# Patient Record
Sex: Male | Born: 1985 | Race: Asian | Hispanic: No | Smoking: Never smoker
Health system: Southern US, Community
[De-identification: ages and names within clinical notes are randomized; demographics above are authoritative.]

## PROBLEM LIST (undated history)

## (undated) DIAGNOSIS — C801 Malignant (primary) neoplasm, unspecified: Secondary | ICD-10-CM

## (undated) DIAGNOSIS — R51 Headache: Secondary | ICD-10-CM

## (undated) DIAGNOSIS — F329 Major depressive disorder, single episode, unspecified: Secondary | ICD-10-CM

## (undated) DIAGNOSIS — F32A Depression, unspecified: Secondary | ICD-10-CM

## (undated) DIAGNOSIS — F419 Anxiety disorder, unspecified: Secondary | ICD-10-CM

## (undated) DIAGNOSIS — I10 Essential (primary) hypertension: Secondary | ICD-10-CM

## (undated) DIAGNOSIS — R519 Headache, unspecified: Secondary | ICD-10-CM

---

## 2014-01-14 ENCOUNTER — Other Ambulatory Visit (HOSPITAL_COMMUNITY): Payer: Self-pay | Admitting: Otolaryngology

## 2014-01-14 DIAGNOSIS — R590 Localized enlarged lymph nodes: Secondary | ICD-10-CM

## 2014-01-21 ENCOUNTER — Ambulatory Visit (HOSPITAL_COMMUNITY)
Admission: RE | Admit: 2014-01-21 | Discharge: 2014-01-21 | Disposition: A | Discharge status: Home / Self Care | Payer: BC Managed Care – PPO | Source: Ambulatory Visit | Attending: Otolaryngology | Admitting: Otolaryngology

## 2014-01-21 ENCOUNTER — Encounter (HOSPITAL_COMMUNITY): Payer: Self-pay

## 2014-01-21 DIAGNOSIS — R590 Localized enlarged lymph nodes: Secondary | ICD-10-CM | POA: Insufficient documentation

## 2014-01-21 MED ORDER — IOHEXOL 300 MG/ML  SOLN
80.0000 mL | Freq: Once | INTRAMUSCULAR | Status: AC | PRN
  Administered 2014-01-21: 80 mL via INTRAVENOUS

## 2014-01-22 ENCOUNTER — Other Ambulatory Visit: Payer: Self-pay | Admitting: Otolaryngology

## 2014-02-05 ENCOUNTER — Other Ambulatory Visit: Payer: Self-pay | Admitting: Otolaryngology

## 2014-02-05 NOTE — H&P (Signed)
[place], Haste 28 y.o., male [id number]     Chief Complaint:  RIGHT thyroid cancer with adenopathy and  HPI: 28 year old Asian male was noted to have some fullness in his lower neck on routine physical exam in JUN. He lives in [place] and his evaluation thus far has been performed there.  In uncertain order, he had a thyroid ultrasound, and a CT scan of the neck.  A CT scan showed multiple RIGHT neck nodes and some small nodules in the RIGHT thyroid gland.  I reviewed this report but do not have the films to review.  A fine needle aspiration biopsy showed thyroid cancer, type unspecified.  I do not have any of these records.  Dr. Cherrie Distance, presumably a surgeon in [place], did not want to work with him further and referred him to [facility] [place].  His brother lives in [place] and brought him to see [place] instead.   He is anxious about the mass, but otherwise has absolutely no symptoms including pain, breathing or swallowing difficulty, or any mass effect.  No family history of thyroid cancer.  No history of radiation therapy in any form.  He does not smoke.   No hoarseness or hemoptysis.  No other imaging thus far.   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  He is not aware of any particular change in the size of the masses since first discovered in June.  Preoperative visit for this 28 year old Asian male.  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  Where on scheduled to perform a RIGHT neck dissection (including level VI) and total thyroidectomy  in 2 days.  He is not having any active symptoms.   I discussed the surgery in detail including risks and complications.  Special mention was made of the parathyroid glands, the recurrent laryngeal nerves, and the RIGHT spinal accessory nerve given his vigorous occupation.  Questions were answered and informed consent was  obtained.  Will be in the hospital several days.  Prescriptions for home use include hydrocodone, calcium tablets, and Synthroid.  We will check a comprehensive metabolic panel prior to surgery.  PMH:No past medical history on file.  Surg Hx:No past surgical history on file.  FHx:  No family history on file. SocHx:  has no tobacco, alcohol, and drug history on file.  ALLERGIES: No Known Allergies   (Not in a hospital admission)  No results found for this or any previous visit (from the past 48 hour(s)). No results found.  WUJ:WJXBJYNW: Not feeling tired (fatigue).  No fever, no night sweats, and no recent weight loss. Head: No headache. Eyes: No eye symptoms. Otolaryngeal: No hearing loss, no earache, no tinnitus, and no purulent nasal discharge.  No nasal passage blockage (stuffiness), no snoring, no sneezing, no hoarseness, and no sore throat. Cardiovascular: No chest pain or discomfort  and no palpitations. Pulmonary: No dyspnea, no cough, and no wheezing. Gastrointestinal: No dysphagia  and no heartburn.  No nausea, no abdominal pain, and no melena.  No diarrhea. Genitourinary: No dysuria. Endocrine: No muscle weakness. Musculoskeletal: No calf muscle cramps, no arthralgias, and no soft tissue swelling. Neurological: No dizziness, no fainting, no tingling, and no numbness. Psychological: No anxiety  and no depression. Skin: No rash.   BP:155/94,  HR: 93 b/min,  Weight: 253 lb ,  BMI Calculated: 39.33 ,    PHYSICAL EXAM: He is stocky and  overweight.  Mental status is appropriate.  He hears well in conversational speech.  Voice is clear and respirations unlabored through the nose.  The head is atraumatic and neck supple.  Cranial nerves intact.  Ear canals are clear.  Anterior nose clear.  Oral cavity is clear with teeth in good repair.  Oral pharynx clear.  Hypopharynx/larynx with mirror examination show mobile vocal cords.  Neck exam with some fullness in the RIGHT thyroid  region.  No distinct adenopathy.  Negative Chvostek's sign  on both sides.     Lungs: Clear to auscultation Heart: Regular rate and rhythm without murmurs Abdomen: Heavy but active Extremities: Normal configuration Neurologic: Symmetric, grossly intact.  Studies Reviewed:  CT Neck    Assessment/Plan Thyroid cancer Worsening (193) (C73). Cervical lymphadenopathy (785.6) (R59.0). Benign essential hypertension Improving (401.1) (I10). BMI 39.0-39.9,adult (V85.39) (Z68.39).  Your surgery is scheduled for Thursday morning at 10:00.  Show up at the main entrance to [facility] at 8:00 please.  I am leaving you with 3 prescriptions: one for calcium, one for thyroid replacement, and one for pain medication.  you will not need these until you get home from the hospital.  Buy a small bottle of chlorhexidine (Hibiclens) scrub soap and shower with this Wednesday night and again Thursday morning especially your upper chest and both sides of your neck.   You will be in the hospital probably 4-5 days.  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  Calcium 600+D 600-200 MG-UNIT Oral Tablet;TAKE 1 TABLET 3 TIMES DAILY; Qty45; R1; Rx. Hydrocodone-Acetaminophen 5-325 MG Oral Tablet;1-2 po q4-6h prn pain; Qty40; R0; Rx. Levothyroxine Sodium 125 MCG Oral Tablet;TAKE 1 TABLET DAILY; JSE83; R2; Rx.  Erik Obey, Makinzie Considine 15/17/6160, 8:34 PM

## 2014-02-06 ENCOUNTER — Encounter (HOSPITAL_COMMUNITY): Payer: Self-pay | Admitting: *Deleted

## 2014-02-06 NOTE — Progress Notes (Signed)
[place] and requested copies of EKG, CXR, Stress test, Echo and last OV notes. Pt states he had all of this done this past summer. He states his EKG showed "something" when he went in for a work physical and that is why he ended up having an Echo and stress test. He states that those tests were all normal. He states he has never been diagnosed with hypo or hyperthyroidism.

## 2014-02-07 ENCOUNTER — Inpatient Hospital Stay (HOSPITAL_COMMUNITY)
Admission: RE | Admit: 2014-02-07 | Discharge: 2014-02-13 | DRG: 626 | Disposition: A | Discharge status: Home / Self Care | Payer: BC Managed Care – PPO | Source: Ambulatory Visit | Attending: Otolaryngology | Admitting: Otolaryngology

## 2014-02-07 ENCOUNTER — Inpatient Hospital Stay (HOSPITAL_COMMUNITY): Payer: BC Managed Care – PPO | Admitting: Certified Registered Nurse Anesthetist

## 2014-02-07 ENCOUNTER — Inpatient Hospital Stay (HOSPITAL_COMMUNITY): Payer: BC Managed Care – PPO

## 2014-02-07 ENCOUNTER — Encounter (HOSPITAL_COMMUNITY)
Admission: RE | Disposition: A | Discharge status: Home / Self Care | Payer: Self-pay | Source: Ambulatory Visit | Attending: Otolaryngology

## 2014-02-07 ENCOUNTER — Encounter (HOSPITAL_COMMUNITY): Payer: Self-pay | Admitting: Certified Registered Nurse Anesthetist

## 2014-02-07 DIAGNOSIS — I10 Essential (primary) hypertension: Secondary | ICD-10-CM | POA: Diagnosis present

## 2014-02-07 DIAGNOSIS — R1310 Dysphagia, unspecified: Secondary | ICD-10-CM | POA: Diagnosis not present

## 2014-02-07 DIAGNOSIS — C770 Secondary and unspecified malignant neoplasm of lymph nodes of head, face and neck: Secondary | ICD-10-CM | POA: Diagnosis present

## 2014-02-07 DIAGNOSIS — Z01818 Encounter for other preprocedural examination: Secondary | ICD-10-CM

## 2014-02-07 DIAGNOSIS — C73 Malignant neoplasm of thyroid gland: Principal | ICD-10-CM | POA: Diagnosis present

## 2014-02-07 DIAGNOSIS — J3800 Paralysis of vocal cords and larynx, unspecified: Secondary | ICD-10-CM | POA: Diagnosis present

## 2014-02-07 HISTORY — PX: RADICAL NECK DISSECTION: SHX2284

## 2014-02-07 HISTORY — DX: Major depressive disorder, single episode, unspecified: F32.9

## 2014-02-07 HISTORY — PX: THYROIDECTOMY: SHX17

## 2014-02-07 HISTORY — DX: Headache, unspecified: R51.9

## 2014-02-07 HISTORY — DX: Anxiety disorder, unspecified: F41.9

## 2014-02-07 HISTORY — DX: Depression, unspecified: F32.A

## 2014-02-07 HISTORY — DX: Malignant (primary) neoplasm, unspecified: C80.1

## 2014-02-07 HISTORY — DX: Essential (primary) hypertension: I10

## 2014-02-07 HISTORY — DX: Headache: R51

## 2014-02-07 LAB — BASIC METABOLIC PANEL
Anion gap: 13 (ref 5–15)
BUN: 18 mg/dL (ref 6–23)
CO2: 22 meq/L (ref 19–32)
Calcium: 8.7 mg/dL (ref 8.4–10.5)
Chloride: 101 mEq/L (ref 96–112)
Creatinine, Ser: 0.72 mg/dL (ref 0.50–1.35)
GFR calc non Af Amer: >90 mL/min (ref >90)
Glucose, Bld: 102 mg/dL — ABNORMAL HIGH (ref 70–99)
POTASSIUM: 4.4 meq/L (ref 3.7–5.3)
Sodium: 136 mEq/L — ABNORMAL LOW (ref 137–147)

## 2014-02-07 LAB — CBC
HCT: 42.6 % (ref 39.0–52.0)
HEMOGLOBIN: 13.5 g/dL (ref 13.0–17.0)
MCH: 25.3 pg — ABNORMAL LOW (ref 26.0–34.0)
MCHC: 31.7 g/dL (ref 30.0–36.0)
MCV: 79.8 fL (ref 78.0–100.0)
Platelets: 297 10*3/uL (ref 150–400)
RBC: 5.34 MIL/uL (ref 4.22–5.81)
RDW: 13.4 % (ref 11.5–15.5)
WBC: 10.7 10*3/uL — ABNORMAL HIGH (ref 4.0–10.5)

## 2014-02-07 LAB — CALCIUM: CALCIUM: 7.6 mg/dL — AB (ref 8.4–10.5)

## 2014-02-07 SURGERY — DISSECTION, NECK, RADICAL
Anesthesia: General | Site: Neck | Laterality: Right

## 2014-02-07 MED ORDER — PHENYLEPHRINE HCL 10 MG/ML IJ SOLN
10.0000 mg | INTRAVENOUS | Status: DC | PRN
  Administered 2014-02-07: 20 ug/min via INTRAVENOUS

## 2014-02-07 MED ORDER — DEXAMETHASONE SODIUM PHOSPHATE 4 MG/ML IJ SOLN
INTRAMUSCULAR | Status: AC
  Filled 2014-02-07: qty 1

## 2014-02-07 MED ORDER — STERILE WATER FOR INJECTION IJ SOLN
INTRAMUSCULAR | Status: AC
  Filled 2014-02-07: qty 10

## 2014-02-07 MED ORDER — CITALOPRAM HYDROBROMIDE 10 MG PO TABS
10.0000 mg | ORAL_TABLET | Freq: Every day | ORAL | Status: DC
  Administered 2014-02-07 – 2014-02-13 (×7): 10 mg via ORAL
  Filled 2014-02-07 (×8): qty 1

## 2014-02-07 MED ORDER — DEXAMETHASONE SODIUM PHOSPHATE 4 MG/ML IJ SOLN
INTRAMUSCULAR | Status: DC | PRN
  Administered 2014-02-07: 4 mg via INTRAVENOUS

## 2014-02-07 MED ORDER — HYDROMORPHONE HCL 1 MG/ML IJ SOLN
INTRAMUSCULAR | Status: AC
  Filled 2014-02-07: qty 1

## 2014-02-07 MED ORDER — CHLORHEXIDINE GLUCONATE 4 % EX LIQD
1.0000 "application " | Freq: Once | CUTANEOUS | Status: DC
  Filled 2014-02-07: qty 15

## 2014-02-07 MED ORDER — PROPOFOL 10 MG/ML IV BOLUS
INTRAVENOUS | Status: AC
  Filled 2014-02-07: qty 20

## 2014-02-07 MED ORDER — FENTANYL CITRATE 0.05 MG/ML IJ SOLN
INTRAMUSCULAR | Status: AC
  Filled 2014-02-07: qty 5

## 2014-02-07 MED ORDER — LEVOTHYROXINE SODIUM 100 MCG PO TABS
100.0000 ug | ORAL_TABLET | Freq: Every day | ORAL | Status: DC
  Administered 2014-02-08 – 2014-02-13 (×6): 100 ug via ORAL
  Filled 2014-02-07 (×8): qty 1

## 2014-02-07 MED ORDER — ROCURONIUM BROMIDE 100 MG/10ML IV SOLN
INTRAVENOUS | Status: DC | PRN
  Administered 2014-02-07: 30 mg via INTRAVENOUS

## 2014-02-07 MED ORDER — EPHEDRINE SULFATE 50 MG/ML IJ SOLN
INTRAMUSCULAR | Status: AC
  Filled 2014-02-07: qty 1

## 2014-02-07 MED ORDER — BACITRACIN ZINC 500 UNIT/GM EX OINT
1.0000 "application " | TOPICAL_OINTMENT | Freq: Three times a day (TID) | CUTANEOUS | Status: DC
  Administered 2014-02-07 – 2014-02-13 (×17): 1 via TOPICAL
  Filled 2014-02-07: qty 28.35

## 2014-02-07 MED ORDER — DIPHENHYDRAMINE HCL 50 MG/ML IJ SOLN
INTRAMUSCULAR | Status: AC
  Filled 2014-02-07: qty 1

## 2014-02-07 MED ORDER — DIPHENHYDRAMINE HCL 50 MG/ML IJ SOLN
INTRAMUSCULAR | Status: DC | PRN
  Administered 2014-02-07: 25 mg via INTRAVENOUS

## 2014-02-07 MED ORDER — PHENYLEPHRINE HCL 10 MG/ML IJ SOLN
INTRAMUSCULAR | Status: DC | PRN
  Administered 2014-02-07: 80 ug via INTRAVENOUS
  Administered 2014-02-07 (×2): 40 ug via INTRAVENOUS
  Administered 2014-02-07: 120 ug via INTRAVENOUS
  Administered 2014-02-07: 80 ug via INTRAVENOUS
  Administered 2014-02-07: 40 ug via INTRAVENOUS
  Administered 2014-02-07: 80 ug via INTRAVENOUS

## 2014-02-07 MED ORDER — MIDAZOLAM HCL 5 MG/5ML IJ SOLN
INTRAMUSCULAR | Status: DC | PRN
  Administered 2014-02-07 (×2): 2 mg via INTRAVENOUS

## 2014-02-07 MED ORDER — FENTANYL CITRATE 0.05 MG/ML IJ SOLN
INTRAMUSCULAR | Status: DC | PRN
  Administered 2014-02-07: 100 ug via INTRAVENOUS
  Administered 2014-02-07 (×2): 50 ug via INTRAVENOUS
  Administered 2014-02-07: 100 ug via INTRAVENOUS
  Administered 2014-02-07: 50 ug via INTRAVENOUS
  Administered 2014-02-07: 100 ug via INTRAVENOUS
  Administered 2014-02-07: 50 ug via INTRAVENOUS
  Administered 2014-02-07: 100 ug via INTRAVENOUS
  Administered 2014-02-07 (×3): 50 ug via INTRAVENOUS

## 2014-02-07 MED ORDER — LISINOPRIL 20 MG PO TABS
20.0000 mg | ORAL_TABLET | Freq: Every day | ORAL | Status: DC
  Administered 2014-02-07 – 2014-02-13 (×7): 20 mg via ORAL
  Filled 2014-02-07 (×8): qty 1

## 2014-02-07 MED ORDER — HYDROMORPHONE HCL 1 MG/ML IJ SOLN
0.2500 mg | INTRAMUSCULAR | Status: DC | PRN
  Administered 2014-02-07 (×4): 0.5 mg via INTRAVENOUS

## 2014-02-07 MED ORDER — MORPHINE SULFATE 2 MG/ML IJ SOLN
1.0000 mg | INTRAMUSCULAR | Status: DC | PRN
  Administered 2014-02-07 – 2014-02-08 (×8): 2 mg via INTRAVENOUS
  Filled 2014-02-07 (×8): qty 1

## 2014-02-07 MED ORDER — HYDROCODONE-ACETAMINOPHEN 7.5-325 MG/15ML PO SOLN
10.0000 mL | ORAL | Status: DC | PRN
  Administered 2014-02-09 (×2): 20 mL via ORAL
  Filled 2014-02-07 (×2): qty 30

## 2014-02-07 MED ORDER — OXYCODONE HCL 5 MG PO TABS: 5.0000 mg | ORAL_TABLET | Freq: Once | ORAL | Status: DC | PRN

## 2014-02-07 MED ORDER — BACITRACIN ZINC 500 UNIT/GM EX OINT
TOPICAL_OINTMENT | CUTANEOUS | Status: DC | PRN
  Administered 2014-02-07: 1 via TOPICAL

## 2014-02-07 MED ORDER — NEOSTIGMINE METHYLSULFATE 10 MG/10ML IV SOLN
INTRAVENOUS | Status: AC
  Filled 2014-02-07: qty 1

## 2014-02-07 MED ORDER — ONDANSETRON HCL 4 MG PO TABS: 4.0000 mg | ORAL_TABLET | ORAL | Status: DC | PRN

## 2014-02-07 MED ORDER — LIDOCAINE-EPINEPHRINE 1 %-1:100000 IJ SOLN
INTRAMUSCULAR | Status: AC
  Filled 2014-02-07: qty 1

## 2014-02-07 MED ORDER — NEOSTIGMINE METHYLSULFATE 10 MG/10ML IV SOLN
INTRAVENOUS | Status: DC | PRN
  Administered 2014-02-07: 3 mg via INTRAVENOUS

## 2014-02-07 MED ORDER — MIDAZOLAM HCL 2 MG/2ML IJ SOLN
INTRAMUSCULAR | Status: AC
  Filled 2014-02-07: qty 2

## 2014-02-07 MED ORDER — ONDANSETRON HCL 4 MG/2ML IJ SOLN
INTRAMUSCULAR | Status: DC | PRN
  Administered 2014-02-07: 4 mg via INTRAVENOUS

## 2014-02-07 MED ORDER — LIDOCAINE HCL (CARDIAC) 20 MG/ML IV SOLN
INTRAVENOUS | Status: AC
  Filled 2014-02-07: qty 10

## 2014-02-07 MED ORDER — CALCIUM CARBONATE 1250 (500 CA) MG PO TABS
1000.0000 mg | ORAL_TABLET | Freq: Two times a day (BID) | ORAL | Status: DC
  Administered 2014-02-08 – 2014-02-09 (×3): 1000 mg via ORAL
  Filled 2014-02-07 (×7): qty 2

## 2014-02-07 MED ORDER — GLYCOPYRROLATE 0.2 MG/ML IJ SOLN
INTRAMUSCULAR | Status: DC | PRN
  Administered 2014-02-07: 0.4 mg via INTRAVENOUS

## 2014-02-07 MED ORDER — GLYCOPYRROLATE 0.2 MG/ML IJ SOLN
INTRAMUSCULAR | Status: AC
  Filled 2014-02-07: qty 2

## 2014-02-07 MED ORDER — SUCCINYLCHOLINE CHLORIDE 20 MG/ML IJ SOLN
INTRAMUSCULAR | Status: DC | PRN
  Administered 2014-02-07: 60 mg via INTRAVENOUS

## 2014-02-07 MED ORDER — 0.9 % SODIUM CHLORIDE (POUR BTL) OPTIME
TOPICAL | Status: DC | PRN
  Administered 2014-02-07: 1000 mL

## 2014-02-07 MED ORDER — PROPOFOL 10 MG/ML IV BOLUS
INTRAVENOUS | Status: DC | PRN
  Administered 2014-02-07: 50 mg via INTRAVENOUS
  Administered 2014-02-07: 200 mg via INTRAVENOUS

## 2014-02-07 MED ORDER — ONDANSETRON HCL 4 MG/2ML IJ SOLN: 4.0000 mg | INTRAMUSCULAR | Status: DC | PRN

## 2014-02-07 MED ORDER — ROCURONIUM BROMIDE 50 MG/5ML IV SOLN
INTRAVENOUS | Status: AC
  Filled 2014-02-07: qty 1

## 2014-02-07 MED ORDER — OXYCODONE HCL 5 MG/5ML PO SOLN: 5.0000 mg | Freq: Once | ORAL | Status: DC | PRN

## 2014-02-07 MED ORDER — OYSTER CALCIUM 500 MG PO TABS
1000.0000 mg | ORAL_TABLET | Freq: Two times a day (BID) | ORAL | Status: DC
  Filled 2014-02-07: qty 2

## 2014-02-07 MED ORDER — ARTIFICIAL TEARS OP OINT
TOPICAL_OINTMENT | OPHTHALMIC | Status: AC
  Filled 2014-02-07: qty 3.5

## 2014-02-07 MED ORDER — DEXTROSE-NACL 5-0.45 % IV SOLN
INTRAVENOUS | Status: DC
  Administered 2014-02-07 – 2014-02-09 (×6): via INTRAVENOUS

## 2014-02-07 MED ORDER — LACTATED RINGERS IV SOLN
INTRAVENOUS | Status: DC
  Administered 2014-02-07 (×3): via INTRAVENOUS

## 2014-02-07 MED ORDER — LORAZEPAM 0.5 MG PO TABS
0.5000 mg | ORAL_TABLET | Freq: Four times a day (QID) | ORAL | Status: DC | PRN
  Administered 2014-02-09 – 2014-02-12 (×6): 0.5 mg via ORAL
  Filled 2014-02-07 (×6): qty 1

## 2014-02-07 MED ORDER — OXYCODONE HCL 5 MG/5ML PO SOLN
5.0000 mg | ORAL | Status: DC | PRN
Start: 2014-02-07 — End: 2014-02-09
  Administered 2014-02-07: 5 mg via ORAL
  Administered 2014-02-08 (×2): 10 mg via ORAL
  Administered 2014-02-08: 5 mg via ORAL
  Administered 2014-02-09: 10 mg via ORAL
  Filled 2014-02-07: qty 5
  Filled 2014-02-07 (×3): qty 10
  Filled 2014-02-07: qty 5

## 2014-02-07 MED ORDER — LIDOCAINE HCL (CARDIAC) 20 MG/ML IV SOLN
INTRAVENOUS | Status: DC | PRN
  Administered 2014-02-07: 40 mg via INTRAVENOUS
  Administered 2014-02-07: 60 mg via INTRATRACHEAL

## 2014-02-07 MED ORDER — ONDANSETRON HCL 4 MG/2ML IJ SOLN
INTRAMUSCULAR | Status: AC
  Filled 2014-02-07: qty 2

## 2014-02-07 MED ORDER — SUCCINYLCHOLINE CHLORIDE 20 MG/ML IJ SOLN
INTRAMUSCULAR | Status: AC
  Filled 2014-02-07: qty 1

## 2014-02-07 MED ORDER — HYDROMORPHONE HCL 1 MG/ML IJ SOLN
INTRAMUSCULAR | Status: DC | PRN
  Administered 2014-02-07 (×4): .2 mg via INTRAVENOUS
  Administered 2014-02-07: .4 mg via INTRAVENOUS

## 2014-02-07 MED ORDER — BACITRACIN ZINC 500 UNIT/GM EX OINT
TOPICAL_OINTMENT | CUTANEOUS | Status: AC
  Filled 2014-02-07: qty 15

## 2014-02-07 MED ORDER — LIDOCAINE VISCOUS 2 % MT SOLN
15.0000 mL | Freq: Once | OROMUCOSAL | Status: AC
  Administered 2014-02-08: 15 mL via OROMUCOSAL
  Filled 2014-02-07 (×2): qty 15

## 2014-02-07 SURGICAL SUPPLY — 65 items
APPLIER CLIP 9.375 SM OPEN (CLIP) ×3
ATTRACTOMAT 16X20 MAGNETIC DRP (DRAPES) ×3 IMPLANT
BLADE SURG 10 STRL SS (BLADE) ×3 IMPLANT
BLADE SURG 15 STRL LF DISP TIS (BLADE) ×6 IMPLANT
BLADE SURG 15 STRL SS (BLADE) ×3
BLADE SURG ROTATE 9660 (MISCELLANEOUS) IMPLANT
CANISTER SUCTION 2500CC (MISCELLANEOUS) ×3 IMPLANT
CLEANER TIP ELECTROSURG 2X2 (MISCELLANEOUS) ×3 IMPLANT
CLIP APPLIE 9.375 SM OPEN (CLIP) ×2 IMPLANT
CONT SPEC 4OZ CLIKSEAL STRL BL (MISCELLANEOUS) ×3 IMPLANT
CONT SPECI 4OZ STER CLIK (MISCELLANEOUS) ×12 IMPLANT
CORDS BIPOLAR (ELECTRODE) ×3 IMPLANT
COVER SURGICAL LIGHT HANDLE (MISCELLANEOUS) ×3 IMPLANT
CRADLE DONUT ADULT HEAD (MISCELLANEOUS) ×3 IMPLANT
DRAIN CHANNEL 10F 3/8 F FF (DRAIN) IMPLANT
DRAIN CHANNEL 15F RND FF W/TCR (WOUND CARE) IMPLANT
DRAIN PENROSE 1/2X12 LTX STRL (WOUND CARE) ×3 IMPLANT
DRAIN SNY 10 ROU (WOUND CARE) IMPLANT
DRAIN WOUND SNY 15 RND (WOUND CARE) ×3 IMPLANT
ELECT COATED BLADE 2.86 ST (ELECTRODE) ×3 IMPLANT
ELECT PAIRED SUBDERMAL (MISCELLANEOUS) ×3
ELECT REM PT RETURN 9FT ADLT (ELECTROSURGICAL) ×3
ELECTRODE PAIRED SUBDERMAL (MISCELLANEOUS) ×2 IMPLANT
ELECTRODE REM PT RTRN 9FT ADLT (ELECTROSURGICAL) ×2 IMPLANT
EVACUATOR SILICONE 100CC (DRAIN) ×9 IMPLANT
GAUZE SPONGE 4X4 16PLY XRAY LF (GAUZE/BANDAGES/DRESSINGS) ×15 IMPLANT
GLOVE BIOGEL PI IND STRL 6.5 (GLOVE) ×4 IMPLANT
GLOVE BIOGEL PI INDICATOR 6.5 (GLOVE) ×2
GLOVE ECLIPSE 8.0 STRL XLNG CF (GLOVE) ×3 IMPLANT
GLOVE SURG SS PI 6.0 STRL IVOR (GLOVE) ×3 IMPLANT
GLOVE SURG SS PI 6.5 STRL IVOR (GLOVE) ×3 IMPLANT
GLOVE SURG SS PI 7.0 STRL IVOR (GLOVE) ×9 IMPLANT
GOWN STRL REUS W/ TWL LRG LVL3 (GOWN DISPOSABLE) ×4 IMPLANT
GOWN STRL REUS W/ TWL XL LVL3 (GOWN DISPOSABLE) ×8 IMPLANT
GOWN STRL REUS W/TWL LRG LVL3 (GOWN DISPOSABLE) ×2
GOWN STRL REUS W/TWL XL LVL3 (GOWN DISPOSABLE) ×4
KIT BASIN OR (CUSTOM PROCEDURE TRAY) ×3 IMPLANT
KIT ROOM TURNOVER OR (KITS) ×3 IMPLANT
LOCATOR NERVE 3 VOLT (DISPOSABLE) ×3 IMPLANT
NS IRRIG 1000ML POUR BTL (IV SOLUTION) ×3 IMPLANT
PAD ARMBOARD 7.5X6 YLW CONV (MISCELLANEOUS) ×6 IMPLANT
PENCIL BUTTON HOLSTER BLD 10FT (ELECTRODE) ×3 IMPLANT
SCRUB FOAM CHG 2% SURGICAL (MISCELLANEOUS) ×3 IMPLANT
SHEARS HARMONIC 9CM CVD (BLADE) ×3 IMPLANT
SPECIMEN JAR MEDIUM (MISCELLANEOUS) IMPLANT
SPECIMEN JAR SMALL (MISCELLANEOUS) IMPLANT
SPONGE INTESTINAL PEANUT (DISPOSABLE) ×3 IMPLANT
SPONGE LAP 18X18 X RAY DECT (DISPOSABLE) ×3 IMPLANT
STAPLER VISISTAT 35W (STAPLE) ×3 IMPLANT
STRIP CLOSURE SKIN 1/2X4 (GAUZE/BANDAGES/DRESSINGS) IMPLANT
SUT CHROMIC 3 0 PS 2 (SUTURE) ×9 IMPLANT
SUT CHROMIC 4 0 PS 2 18 (SUTURE) ×9 IMPLANT
SUT CHROMIC 5 0 P 3 (SUTURE) IMPLANT
SUT ETHILON 3 0 PS 1 (SUTURE) ×6 IMPLANT
SUT ETHILON 5 0 PS 2 18 (SUTURE) ×3 IMPLANT
SUT SILK 2 0 REEL (SUTURE) ×6 IMPLANT
SUT SILK 2 0 SH CR/8 (SUTURE) ×3 IMPLANT
SUT SILK 3 0 REEL (SUTURE) ×12 IMPLANT
SUT SILK 4 0 REEL (SUTURE) IMPLANT
TOWEL OR 17X24 6PK STRL BLUE (TOWEL DISPOSABLE) ×3 IMPLANT
TOWEL OR 17X26 10 PK STRL BLUE (TOWEL DISPOSABLE) ×3 IMPLANT
TRAY ENT MC OR (CUSTOM PROCEDURE TRAY) ×3 IMPLANT
TRAY FOLEY CATH 14FRSI W/METER (CATHETERS) ×3 IMPLANT
TUBE ENDOTRAC EMG 8X11.3 (MISCELLANEOUS) ×3 IMPLANT
WATER STERILE IRR 1000ML POUR (IV SOLUTION) ×3 IMPLANT

## 2014-02-07 NOTE — Anesthesia Procedure Notes (Signed)
Procedure Name: Intubation Date/Time: 02/07/2014 10:21 AM Performed by: Maryland Pink Pre-anesthesia Checklist: Patient identified, Emergency Drugs available, Suction available, Patient being monitored and Timeout performed Patient Re-evaluated:Patient Re-evaluated prior to inductionOxygen Delivery Method: Circle system utilized Preoxygenation: Pre-oxygenation with 100% oxygen Intubation Type: IV induction Ventilation: Mask ventilation without difficulty Laryngoscope Size: Mac and 4 Grade View: Grade I Tube type: NIMS tube. Tube size: 8.0 mm Number of attempts: 1 Airway Equipment and Method: Stylet and LTA kit utilized Placement Confirmation: ETT inserted through vocal cords under direct vision,  positive ETCO2 and breath sounds checked- equal and bilateral Secured at: 24 cm Tube secured with: Tape Dental Injury: Teeth and Oropharynx as per pre-operative assessment

## 2014-02-07 NOTE — Op Note (Signed)
02/07/2014  4:47 PM    [name]  [id number]   Pre-Op Dx:  Papillary carcinoma of thyroid with RIGHT neck mestastatic nodes  Post-op Dx: Papillary carcinoma or thyroid with bilateral neck metastatic nodes  Proc: Total Thyroidectomy, RIGHT modified radical neck dissection   Surg:  [name] T MD  Anes:  GOT  EBL:  200 ml  Comp:  none  Findings:  Multiple firm nodules of bilat thyroid lobes, with tumor adherent to lateral tracheal wall on both sides.  Recurrent laryngeal nerve going directly into tumor masses on both sides.  Several firm 1-2.5 cm LEFT Level III, IV nodes, one frozen section positive for papillary ca thyroid.  Multiple rock hard RIGHT neck nodes with obstruction of jugular vein at level II-III junction.  Residual tumor left in the T-E groove on both sides.  Sharp dissection of tumor away from the presumed location of the nerve on each side.    Upon completion of procedure, flexible laryngoscopy shows immobile LEFT cord, some possible motion RIGHT vocal cord.  Airway decent.    Procedure: With the patient in a comfortable supine position, GOT anesthesia was achieved using a nerve monitoring tube.    At an appropriate level, a shoulder roll was placed and the neck was extended and the head supported in the standard fashion. The previously performed marking of the external skin was identified. A Hibiclens sterile preparation and draping of the entire neck and upper chest was performed on both sides.  The skin incision was crosshatched for orientation. Beginning 5 cm to the left of midline, incision was sharply executed in a low utility flap style. This was carried through skin sharply, and then using cutting and coagulating cautery through subcutaneous fat and platysma muscle.   subplatysmal flaps were raised superiorly and inferiorly.  Beginning in the midline, the fascia overlying the strap muscles was dissected medially and then the strap muscles were retracted left  laterally to the isthmus of the thyroid gland. Working directly on the capsule of the gland, dissection was carried up the superior lobe and the superior pedicle was controlled with silk ligature, and with harmonic scalpel. Working around the lateral capsule, several significant vessels were controlled successfully. Working inferiorly, there was a hard tumor mass which seem to be fixed to the tracheal wall.  Working deep to the superior pole, the recurrent laryngeal nerve was identified as it entered the larynx below the cricoid cartilage. From this vantage point, the nervew was dissected downward.  After approximately 15 mm, it entered a block of firm tumor could not be further dissected.  Working between the anterior bellies of the sternomastoid muscle and down towards the sternal notch, soft tissue was dissected. Possible thymic tissue was elevated and partially delivered with the specimen. No distinct nodes were identified. The dissection was carried down to the innominate vein and artery. The recurrent nerve on the left was not further identified in the deep tracheoesophageal groove.  The right superior pole of the thyroid was controlled. Fascia overlying the cricothyroid membrane and cricothyroid muscles was elevated and remained in continuity with the gland.  Working on the lateral aspect of the gland and around the inferior pole, once again there were very hard tissues consistent with tumor. The recurrent nerve was identified underneath the cricoid and was followed into tumor directly and could not be further dissected. Working inferiorly, the gland in continuity with level VI node bearing tissues was dissected downward.  Once again, the right recurrent laryngeal  nerve could not be identified inferior to the tumor.  Working under direct vision, the tumor was sharply lysed lateral to the presumed location of the nerve on both sides. A small amount of bipolar cautery was required for hemostasis. At  this point dissection beneath the thyroid isthmus and down towards Barry's ligament on both sides delivered the thyroid gland and the level VI tissues. 1 small parathyroid gland was identified and preserved on each side.  The thyroid bed was palpated. Several firm nodes were noted in the left level III and IV jugular chain.  A 2.5 cm node was dissected and removed and sent for frozen section interpretation. This returned as metastatic papillary carcinoma.  Election was made to proceed with the right neck dissection. Subplatysmal flaps superiorly and inferiorly had been developed. Working on the lateral surface of the sternocleidomastoid muscle, fascia was unwrapped from the muscle anteriorly and posteriorly. The tail of the parotid was crossclamped between the mastoid tip and the angle of the mandible. Several small suspicious nodal structures were noted. Soft tissues were dissected off of the fascia of level I which was not further dissected disturbed. Fashion soft tissues were dissected off the strap muscles from medially and brought posteriorly. Working on the medial surface of the sternocleidomastoid muscle, the jugular vein was visualized. Superiorly, the spinal accessory nerve was noted. No bearing tissues posterior and superior to the spinal accessory nerve were dissected away from the medial surface of the sternomastoid muscle, and the lateral surface of the trapezius and levator scapulae muscles. The nerve was dissected free of the soft tissues.  Working posterior to the muscle, the trapezius muscle was identified and dissection was carried down along the free edge of the trapezius muscle. Spinal accessory nerve was identified posteriorly and carried up to the sternomastoid muscle. Carefully dissected. The muscular floor of level V was dissected forward leaving some length to the cervical nerve roots as encountered. The posterior inferior level V, several branches of the transverse cervical artery and  vein were identified and controlled. The dissection was carried deep to the posterior belly of the omohyoid muscle inferiorly and upward towards the jugular vein.  Returning to the superior aspect, the fascia was dissected off of the anteriorand posterior belly of the digastric muscle .  Ranine  veins were controlled. Hypoglossal nerve was noted and not disturbed.  Working from anteriorly and posteriorly, the jugular vein was approached. Fascia  was dissected off the carotid sheath and the vagus nerve was intact.    Working directly along the jugular vein, multiple nodes were encountered and in fact at the level II-III junction, superior jugular vein was occluded by a firm tumor mass. Sacrifice of the jugular vein was felt indicated. The fascia of the jugular vein was sharply incised and the fascia unwrapped from the full circumference of the jugular vein superiorly to inferiorly. Several presumed lymphatic vessels were identified inferiorly and controlled with silk ligature. Fascial floor of the posterior triangle was carefully protected in order to preserve the phrenic nerve.  Finally, the inferior jugular vein was controlled with 2-0 silk ligatures and suture ligatures. The vein was divided and the specimen was delivered. Several orienting sutures were applied.  A full adequate dissection of levels 2, 3, 4, and 5 on the right neck was felt to have been accomplished. Given jugular vein sacrifice, possibility of a staged left neck dissection was considered and elected.  At this point the excision was completed. The wound was irrigated and suctioned  clear. Hemostasis was observed. 73 French perforated round drains were placed into the thyroid bed and into the right neck and secured at the skin with a 3-0 nylon stitch.these were placed to low continuous suction.  The head was returned to midline and the shoulder roll was removed. The flaps were reapproximated using orienting marks using 4-0 chromic gut at  the platysma layer, and staples on the skin. The drains were fully functional.  Prior to fully awakening the patient, we examined him with the glide scope and also with a flexible laryngoscope. There seem to be some motion of the left cord minimal motion of the right cord. They were in paramedian positions generally and the airway was adequate.    Patient was returned to anesthesia, awakened, extubated,And transferred to recovery in excellent condition.  Including sacrifice of the right jugular vein, a full therapeutic neck dissection was felt to have been accomplished on the right side. There  was distinct residual tumor in the tracheoesophageal groove on each side and the recurrent laryngeal nerve was presumptively preserved with sharp dissection of tumor away from the location of the nerve. There was residual presumed metastatic lymphadenopathy in the left low jugular chain.  Dispo:   PACU to 3300. Will observe carefully for vocal cord motion. Will supplement thyroid function, and also calcium. We'll monitor calcium every 8 hours. He will need radioactive iodine ablation, and probably also external irradiation. We may consider a staged left neck dissection to avoid major venous compromise to the head and neck.  Plan:  For now, ice, elevation, analgesia. Advance diet and activity. Anticipate a 3-4 day hospital stay.  Tyson Alias MD

## 2014-02-07 NOTE — Transfer of Care (Signed)
Immediate Anesthesia Transfer of Care Note  Patient: [place]  Procedure(s) Performed: Procedure(s): RIGHT NECK DISSECTION (Right) TOTAL THYROIDECTOMY (N/A)  Patient Location: PACU  Anesthesia Type:General  Level of Consciousness: sedated and responds to stimulation  Airway & Oxygen Therapy: Patient Spontanous Breathing and Patient connected to face mask oxygen  Post-op Assessment: Report given to PACU RN, Post -op Vital signs reviewed and stable and Patient moving all extremities  Post vital signs: Reviewed and stable  Complications: No apparent anesthesia complications

## 2014-02-07 NOTE — Anesthesia Preprocedure Evaluation (Signed)
Anesthesia Evaluation  Patient identified by MRN, date of birth, ID band Patient awake    History of Anesthesia Complications Negative for: history of anesthetic complications  Airway Mallampati: II  TM Distance: >3 FB Neck ROM: Full    Dental  (+) Teeth Intact, Dental Advisory Given   Pulmonary neg pulmonary ROS,    Pulmonary exam normal       Cardiovascular hypertension, Pt. on medications     Neuro/Psych  Headaches, Anxiety Depression    GI/Hepatic negative GI ROS, Neg liver ROS,   Endo/Other  negative endocrine ROS  Renal/GU negative Renal ROS     Musculoskeletal   Abdominal   Peds  Hematology   Anesthesia Other Findings   Reproductive/Obstetrics                             Anesthesia Physical Anesthesia Plan  ASA: II  Anesthesia Plan: General   Post-op Pain Management:    Induction: Intravenous  Airway Management Planned: Oral ETT  Additional Equipment:   Intra-op Plan:   Post-operative Plan: Extubation in OR  Informed Consent: I have reviewed the patients History and Physical, chart, labs and discussed the procedure including the risks, benefits and alternatives for the proposed anesthesia with the patient or authorized representative who has indicated his/her understanding and acceptance.   Dental advisory given  Plan Discussed with: Anesthesiologist and Surgeon  Anesthesia Plan Comments:         Anesthesia Quick Evaluation

## 2014-02-07 NOTE — H&P (View-Only) (Signed)
G[place], Alleva 28 y.o., male [id number]     Chief Complaint:  RIGHT thyroid cancer with adenopathy and  HPI: 28 year old Asian male was noted to have some fullness in his lower neck on routine physical exam in JUN. He lives in [place] and his evaluation thus far has been performed there.  In uncertain order, he had a thyroid ultrasound, and a CT scan of the neck.  A CT scan showed multiple RIGHT neck nodes and some small nodules in the RIGHT thyroid gland.  I reviewed this report but do not have the films to review.  A fine needle aspiration biopsy showed thyroid cancer, type unspecified.  I do not have any of these records.  Dr. Cherrie Distance, presumably a surgeon in [place], did not want to work with him further and referred him to [facility].  His brother lives in [place] and brought him to see [place] instead.   He is anxious about the mass, but otherwise has absolutely no symptoms including pain, breathing or swallowing difficulty, or any mass effect.  No family history of thyroid cancer.  No history of radiation therapy in any form.  He does not smoke.   No hoarseness or hemoptysis.  No other imaging thus far.   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  He is not aware of any particular change in the size of the masses since first discovered in June.  Preoperative visit for this 28 year old Asian male.  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  Where on scheduled to perform a RIGHT neck dissection (including level VI) and total thyroidectomy  in 2 days.  He is not having any active symptoms.   I discussed the surgery in detail including risks and complications.  Special mention was made of the parathyroid glands, the recurrent laryngeal nerves, and the RIGHT spinal accessory nerve given his vigorous occupation.  Questions were answered and informed consent was  obtained.  Will be in the hospital several days.  Prescriptions for home use include hydrocodone, calcium tablets, and Synthroid.  We will check a comprehensive metabolic panel prior to surgery.  PMH:No past medical history on file.  Surg Hx:No past surgical history on file.  FHx:  No family history on file. SocHx:  has no tobacco, alcohol, and drug history on file.  ALLERGIES: No Known Allergies   (Not in a hospital admission)  No results found for this or any previous visit (from the past 48 hour(s)). No results found.  BWI:OMBTDHRC: Not feeling tired (fatigue).  No fever, no night sweats, and no recent weight loss. Head: No headache. Eyes: No eye symptoms. Otolaryngeal: No hearing loss, no earache, no tinnitus, and no purulent nasal discharge.  No nasal passage blockage (stuffiness), no snoring, no sneezing, no hoarseness, and no sore throat. Cardiovascular: No chest pain or discomfort  and no palpitations. Pulmonary: No dyspnea, no cough, and no wheezing. Gastrointestinal: No dysphagia  and no heartburn.  No nausea, no abdominal pain, and no melena.  No diarrhea. Genitourinary: No dysuria. Endocrine: No muscle weakness. Musculoskeletal: No calf muscle cramps, no arthralgias, and no soft tissue swelling. Neurological: No dizziness, no fainting, no tingling, and no numbness. Psychological: No anxiety  and no depression. Skin: No rash.   BP:155/94,  HR: 93 b/min,  Weight: 253 lb ,  BMI Calculated: 39.33 ,    PHYSICAL EXAM: He is stocky and  overweight.  Mental status is appropriate.  He hears well in conversational speech.  Voice is clear and respirations unlabored through the nose.  The head is atraumatic and neck supple.  Cranial nerves intact.  Ear canals are clear.  Anterior nose clear.  Oral cavity is clear with teeth in good repair.  Oral pharynx clear.  Hypopharynx/larynx with mirror examination show mobile vocal cords.  Neck exam with some fullness in the RIGHT thyroid  region.  No distinct adenopathy.  Negative Chvostek's sign  on both sides.     Lungs: Clear to auscultation Heart: Regular rate and rhythm without murmurs Abdomen: Heavy but active Extremities: Normal configuration Neurologic: Symmetric, grossly intact.  Studies Reviewed:  CT Neck    Assessment/Plan Thyroid cancer Worsening (193) (C73). Cervical lymphadenopathy (785.6) (R59.0). Benign essential hypertension Improving (401.1) (I10). BMI 39.0-39.9,adult (V85.39) (Z68.39).  Your surgery is scheduled for Thursday morning at 10:00.  Show up at the main entrance to [facility] at 8:00 please.  I am leaving you with 3 prescriptions: one for calcium, one for thyroid replacement, and one for pain medication.  you will not need these until you get home from the hospital.  Buy a small bottle of chlorhexidine (Hibiclens) scrub soap and shower with this Wednesday night and again Thursday morning especially your upper chest and both sides of your neck.   You will be in the hospital probably 4-5 days.  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  Calcium 600+D 600-200 MG-UNIT Oral Tablet;TAKE 1 TABLET 3 TIMES DAILY; Qty45; R1; Rx. Hydrocodone-Acetaminophen 5-325 MG Oral Tablet;1-2 po q4-6h prn pain; Qty40; R0; Rx. Levothyroxine Sodium 125 MCG Oral Tablet;TAKE 1 TABLET DAILY; XBL39; R2; Rx.  Erik Obey, [place] 03/00/9233, 8:34 PM

## 2014-02-07 NOTE — Interval H&P Note (Signed)
History and Physical Interval Note:  02/07/2014 9:33 AM  [place]  has presented today for surgery, with the diagnosis of PAPILLARY CANCER THYROID  The various methods of treatment have been discussed with the patient and family. After consideration of risks, benefits and other options for treatment, the patient has consented to  Procedure(s): RIGHT NECK DISSECTION (Right) TOTAL THYROIDECTOMY (N/A) as a surgical intervention .  The patient's history has been re-reviewed, patient re-examined, no change in status, stable for surgery.  I have re-reviewed the patient's chart and labs.  Questions were answered to the patient's satisfaction.     [name]

## 2014-02-07 NOTE — Anesthesia Postprocedure Evaluation (Signed)
  Anesthesia Post-op Note  Patient: Randall Walsh  Procedure(s) Performed: Procedure(s): RIGHT NECK DISSECTION (Right) TOTAL THYROIDECTOMY (N/A)  Patient Location: PACU  Anesthesia Type:General  Level of Consciousness: awake  Airway and Oxygen Therapy: Patient Spontanous Breathing  Post-op Pain: mild  Post-op Assessment: Post-op Vital signs reviewed, Patient's Cardiovascular Status Stable, Respiratory Function Stable, Patent Airway, No signs of Nausea or vomiting and Pain level controlled  Post-op Vital Signs: Reviewed and stable  Last Vitals:  Filed Vitals:   02/07/14 1700  BP:   Pulse: 72  Temp:   Resp: 19    Complications: No apparent anesthesia complications

## 2014-02-08 ENCOUNTER — Encounter (HOSPITAL_COMMUNITY): Payer: Self-pay | Admitting: Otolaryngology

## 2014-02-08 LAB — MRSA PCR SCREENING: MRSA BY PCR: POSITIVE — AB

## 2014-02-08 LAB — CALCIUM
CALCIUM: 7.3 mg/dL — AB (ref 8.4–10.5)
CALCIUM: 7.4 mg/dL — AB (ref 8.4–10.5)
Calcium: 7.6 mg/dL — ABNORMAL LOW (ref 8.4–10.5)

## 2014-02-08 MED ORDER — MUPIROCIN 2 % EX OINT
1.0000 "application " | TOPICAL_OINTMENT | Freq: Two times a day (BID) | CUTANEOUS | Status: AC
  Administered 2014-02-08 – 2014-02-12 (×10): 1 via NASAL
  Filled 2014-02-08 (×2): qty 22

## 2014-02-08 MED ORDER — CHLORHEXIDINE GLUCONATE CLOTH 2 % EX PADS
6.0000 | MEDICATED_PAD | Freq: Every day | CUTANEOUS | Status: AC
Start: 2014-02-08 — End: 2014-02-13
  Administered 2014-02-08 – 2014-02-12 (×4): 6 via TOPICAL

## 2014-02-08 NOTE — Progress Notes (Signed)
Report called to Lynden Ang, RN on 6N.

## 2014-02-08 NOTE — Plan of Care (Signed)
Problem: Phase I Progression Outcomes Goal: Pain controlled with appropriate interventions Outcome: Completed/Met Date Met:  02/08/14 Goal: OOB as tolerated unless otherwise ordered Outcome: Completed/Met Date Met:  02/08/14 Goal: Initial discharge plan identified Outcome: Completed/Met Date Met:  02/08/14 Goal: Voiding-avoid urinary catheter unless indicated Outcome: Completed/Met Date Met:  02/08/14 Goal: Hemodynamically stable Outcome: Completed/Met Date Met:  02/08/14

## 2014-02-08 NOTE — Progress Notes (Signed)
Pt transferring to 6N01. CMT notified. VSS and no c/o pain at this time. Pt's personal belongings at bedside with family.

## 2014-02-08 NOTE — Progress Notes (Signed)
Patient calcium level 7.6 .Call placed to Dr.Bates.No new orders received at present time.

## 2014-02-08 NOTE — Progress Notes (Signed)
02/08/2014 9:57 AM  [name] [id number]  Post-Op Day 1    Temp:  [97.4 F (36.3 C)-98.5 F (36.9 C)] 98 F (36.7 C) (11/13 0740) Pulse Rate:  [65-92] 68 (11/13 0740) Resp:  [17-24] 23 (11/13 0740) BP: (120-137)/(66-86) 129/85 mmHg (11/13 0740) SpO2:  [94 %-100 %] 98 % (11/13 0740),     Intake/Output Summary (Last 24 hours) at 02/08/14 0957 Last data filed at 02/08/14 0800  Gross per 24 hour  Intake   5427 ml  Output   2205 ml  Net   3222 ml   JP drain 40 ml last shift  Results for orders placed or performed during the hospital encounter of 02/07/14 (from the past 24 hour(s))  MRSA PCR Screening     Status: Abnormal   Collection Time: 02/07/14  7:34 PM  Result Value Ref Range   MRSA by PCR POSITIVE (A) NEGATIVE  Calcium     Status: Abnormal   Collection Time: 02/07/14 10:53 PM  Result Value Ref Range   Calcium 7.6 (L) 8.4 - 10.5 mg/dL  Calcium     Status: Abnormal   Collection Time: 02/08/14  6:00 AM  Result Value Ref Range   Calcium 7.3 (L) 8.4 - 10.5 mg/dL    SUBJECTIVE:  Mod pain.  SCD compression keeping him awake.  Voice raspy, but breathing OK.  Able to swallow OK.  No peri-oral or digital tingling/twitching.  OBJECTIVE:  Neg Schvostek's.  R mandibularis, CN XI intact.  Neck flat.  Drains functional.  Voice weak, min stridor with brisk inhalation.    Using 5 ml 2% viscous xylocaine per nostrils, with informed consent, flex laryngoscopy performed at bedside.  Some residual VC motion on both sides, better adduction than abduction.  Airway adequate.    IMPRESSION:  Satisfactory check.  Reduced but not absent VC motion bilat.  Ca++ 7.3 with no symptoms.  PLAN:  Out of ICU.  D/C Foley. Cont. Wound drainage.  Follow Ca++.  Advance diet and activity.    Discussed findings and plans with patient and family.    Randall Walsh

## 2014-02-09 LAB — CALCIUM
CALCIUM: 7 mg/dL — AB (ref 8.4–10.5)
Calcium: 7.7 mg/dL — ABNORMAL LOW (ref 8.4–10.5)
Calcium: 8.1 mg/dL — ABNORMAL LOW (ref 8.4–10.5)

## 2014-02-09 MED ORDER — HYDROMORPHONE HCL 1 MG/ML IJ SOLN
0.5000 mg | INTRAMUSCULAR | Status: DC | PRN
  Administered 2014-02-09 – 2014-02-10 (×6): 1 mg via INTRAVENOUS
  Filled 2014-02-09 (×6): qty 1

## 2014-02-09 MED ORDER — CALCIUM CARBONATE 1250 (500 CA) MG PO TABS
1000.0000 mg | ORAL_TABLET | Freq: Three times a day (TID) | ORAL | Status: DC
  Administered 2014-02-09 – 2014-02-11 (×6): 1000 mg via ORAL
  Filled 2014-02-09 (×7): qty 2

## 2014-02-09 MED ORDER — OXYCODONE HCL 5 MG PO TABS
5.0000 mg | ORAL_TABLET | ORAL | Status: DC | PRN
  Administered 2014-02-09 – 2014-02-10 (×3): 10 mg via ORAL
  Filled 2014-02-09 (×3): qty 2

## 2014-02-09 MED ORDER — POLYETHYLENE GLYCOL 3350 17 G PO PACK
17.0000 g | PACK | Freq: Every day | ORAL | Status: DC
  Administered 2014-02-10 – 2014-02-12 (×3): 17 g via ORAL
  Filled 2014-02-09 (×4): qty 1

## 2014-02-09 NOTE — Progress Notes (Signed)
02/09/2014 9:03 AM  [name] [id number]  Post-Op Day 2    Temp:  [97.8 F (36.6 C)-98.1 F (36.7 C)] 97.9 F (36.6 C) (11/14 0720) Pulse Rate:  [75-97] 77 (11/14 0720) Resp:  [20-26] 20 (11/14 0720) BP: (116-135)/(70-83) 116/70 mmHg (11/14 0720) SpO2:  [94 %-100 %] 97 % (11/14 0720),     Intake/Output Summary (Last 24 hours) at 02/09/14 9758 Last data filed at 02/09/14 0600  Gross per 24 hour  Intake   3292 ml  Output   2290 ml  Net   1002 ml   JP drains 300 ml yest  Results for orders placed or performed during the hospital encounter of 02/07/14 (from the past 24 hour(s))  Calcium     Status: Abnormal   Collection Time: 02/08/14  2:00 PM  Result Value Ref Range   Calcium 7.4 (L) 8.4 - 10.5 mg/dL  Calcium     Status: Abnormal   Collection Time: 02/08/14 10:38 PM  Result Value Ref Range   Calcium 7.6 (L) 8.4 - 10.5 mg/dL  Calcium     Status: Abnormal   Collection Time: 02/09/14  5:44 AM  Result Value Ref Range   Calcium 7.0 (L) 8.4 - 10.5 mg/dL    SUBJECTIVE:  C/o HA.  Difficulty breathing.  Some difficulty swallowing. No aspiration.  OBJECTIVE:  Voice soft but phonatory.  No stridor at rest.  Neck sl swollen but no fluid accumulation.  Drains functional.  IMPRESSION:  Satisfactory check.  Hypocalcemia with no symptoms.    PLAN:  Increase Ca++ supplements.  Increase diet and activity.  Remain in hospital until wound drainage reduced.    [name]

## 2014-02-10 LAB — CALCIUM
CALCIUM: 9 mg/dL (ref 8.4–10.5)
Calcium: 8.3 mg/dL — ABNORMAL LOW (ref 8.4–10.5)

## 2014-02-10 MED ORDER — OXYCODONE HCL 5 MG PO TABS
5.0000 mg | ORAL_TABLET | ORAL | Status: DC | PRN
  Administered 2014-02-10 – 2014-02-13 (×12): 15 mg via ORAL
  Filled 2014-02-10 (×12): qty 3

## 2014-02-10 MED ORDER — NAPROXEN 250 MG PO TABS
250.0000 mg | ORAL_TABLET | Freq: Two times a day (BID) | ORAL | Status: DC
  Administered 2014-02-10 – 2014-02-12 (×4): 250 mg via ORAL
  Filled 2014-02-10 (×6): qty 1

## 2014-02-10 MED ORDER — HYDROMORPHONE HCL 2 MG PO TABS
1.0000 mg | ORAL_TABLET | ORAL | Status: DC | PRN
  Administered 2014-02-10: 2 mg via ORAL
  Filled 2014-02-10 (×2): qty 1

## 2014-02-10 NOTE — Progress Notes (Signed)
Discussed pain management with Dr. Erik Obey and changed to Naproxen and increased oxycodone to 5-15mg .  Will try this new regimen for pain and see how it works.

## 2014-02-10 NOTE — Progress Notes (Signed)
02/10/2014 11:51 AM  Randall Walsh [id number]  Post-Op Day 3    Temp:  [97.5 F (36.4 C)-98.3 F (36.8 C)] 98.3 F (36.8 C) (11/15 0954) Pulse Rate:  [75-82] 79 (11/15 0954) Resp:  [16-18] 17 (11/15 0954) BP: (104-142)/(72-88) 129/77 mmHg (11/15 0954) SpO2:  [94 %-98 %] 97 % (11/15 0954),     Intake/Output Summary (Last 24 hours) at 02/10/14 1151 Last data filed at 02/10/14 0938  Gross per 24 hour  Intake   1447 ml  Output    205 ml  Net   1242 ml   JP drain 205 ml.  Results for orders placed or performed during the hospital encounter of 02/07/14 (from the past 24 hour(s))  Calcium     Status: Abnormal   Collection Time: 02/09/14  2:57 PM  Result Value Ref Range   Calcium 7.7 (L) 8.4 - 10.5 mg/dL  Calcium     Status: Abnormal   Collection Time: 02/09/14  9:48 PM  Result Value Ref Range   Calcium 8.1 (L) 8.4 - 10.5 mg/dL  Calcium     Status: Abnormal   Collection Time: 02/10/14  5:52 AM  Result Value Ref Range   Calcium 8.3 (L) 8.4 - 10.5 mg/dL    SUBJECTIVE:  Pain control still elusive.  Hydrocodone not very strong and leaves him "loopy".  Oxycodone not helping.  Morphine with rebound HA.  Tol IV dilaudid yest with good relief.  Voice sl better.  Breathing sl better.  OBJECTIVE:  Voice soft but phonatory  Neck flat.  Drains functional  IMPRESSION:  Satisfactory impovement. Less wound drainage. Ca++ coming up.  PLAN:  Try po dilaudid for pain control.  Increase diet and activity.    [name]

## 2014-02-11 LAB — CALCIUM: CALCIUM: 8.7 mg/dL (ref 8.4–10.5)

## 2014-02-11 MED ORDER — CALCIUM CARBONATE 1250 (500 CA) MG PO TABS
1000.0000 mg | ORAL_TABLET | Freq: Two times a day (BID) | ORAL | Status: DC
  Administered 2014-02-11 – 2014-02-13 (×4): 1000 mg via ORAL
  Filled 2014-02-11 (×4): qty 2

## 2014-02-11 MED ORDER — ACETAMINOPHEN 80 MG PO CHEW
320.0000 mg | CHEWABLE_TABLET | ORAL | Status: DC | PRN
  Filled 2014-02-11: qty 4

## 2014-02-11 NOTE — Progress Notes (Signed)
02/11/2014 9:57 AM  [name] [id number]  Post-Op Day 4    Temp:  [98.1 F (36.7 C)-98.4 F (36.9 C)] 98.1 F (36.7 C) (11/16 0626) Pulse Rate:  [66-79] 79 (11/16 0626) Resp:  [16-18] 18 (11/16 0626) BP: (122-143)/(74-82) 143/82 mmHg (11/16 0929) SpO2:  [90 %-97 %] 91 % (11/16 0626),     Intake/Output Summary (Last 24 hours) at 02/11/14 0957 Last data filed at 02/11/14 0957  Gross per 24 hour  Intake    720 ml  Output    120 ml  Net    600 ml   Drain: 120 ml  Results for orders placed or performed during the hospital encounter of 02/07/14 (from the past 24 hour(s))  Calcium     Status: None   Collection Time: 02/10/14  2:01 PM  Result Value Ref Range   Calcium 9.0 8.4 - 10.5 mg/dL  Calcium     Status: None   Collection Time: 02/11/14  5:21 AM  Result Value Ref Range   Calcium 8.7 8.4 - 10.5 mg/dL    SUBJECTIVE:  Pain better controlled with Oxycodone 15 mg and Naproxyn.  Voice OK.  Swallowing well.  Breathing easily.    OBJECTIVE:  Neck flat.  Voice soft but phonatory.  No stridor.  IMPRESSION:  Satisfactory check  PLAN:  Waiting for wound drainage to become less before removing drains.  Plan to discuss his case at [facility] Wednesday morning.  [name]

## 2014-02-12 LAB — CALCIUM: Calcium: 8.1 mg/dL — ABNORMAL LOW (ref 8.4–10.5)

## 2014-02-12 MED ORDER — NAPROXEN 250 MG PO TABS
250.0000 mg | ORAL_TABLET | Freq: Three times a day (TID) | ORAL | Status: DC
  Administered 2014-02-12 – 2014-02-13 (×3): 250 mg via ORAL
  Filled 2014-02-12 (×3): qty 1

## 2014-02-12 NOTE — Progress Notes (Signed)
02/12/2014 9:23 AM  [name] [id number]  Post-Op Day 5    Temp:  [97.9 F (36.6 C)-98.2 F (36.8 C)] 97.9 F (36.6 C) (11/17 0605) Pulse Rate:  [85-104] 85 (11/17 0605) Resp:  [18] 18 (11/17 0605) BP: (119-143)/(69-86) 127/86 mmHg (11/17 0605) SpO2:  [92 %-96 %] 95 % (11/17 0605),     Intake/Output Summary (Last 24 hours) at 02/12/14 0923 Last data filed at 02/12/14 0844  Gross per 24 hour  Intake   1560 ml  Output     67 ml  Net   1493 ml   Drain:  42 ml yest  Results for orders placed or performed during the hospital encounter of 02/07/14 (from the past 24 hour(s))  Calcium     Status: Abnormal   Collection Time: 02/12/14  4:25 AM  Result Value Ref Range   Calcium 8.1 (L) 8.4 - 10.5 mg/dL    SUBJECTIVE:  Pain controlled with Oxycodone and Naproxyn.  Breathing adequately.  Spont void and BM.  Swallowing solid diet  OBJECTIVE:  Neck flat.  Voice weak but phonatory.  Cough somewhat weak also.  Drains functional.  IMPRESSION:  Satisfactory check. Ca+ down slightly on bid supplements.  Wound drainage decreasing  PLAN:  Anticipate discharge in AM.  Increase Naproxyn at patient's request.    [name]

## 2014-02-12 NOTE — Plan of Care (Signed)
Problem: Phase I Progression Outcomes Goal: No dyspnea or voice changes Outcome: Completed/Met Date Met:  02/12/14 Goal: No signs/symptoms of tetany Outcome: Completed/Met Date Met:  02/12/14 Goal: Incision intact Outcome: Completed/Met Date Met:  02/12/14 Goal: No signs/symptoms of infection/bleeding Outcome: Completed/Met Date Met:  02/12/14 Goal: Serum calcium levels obtained Outcome: Completed/Met Date Met:  02/12/14

## 2014-02-12 NOTE — Discharge Instructions (Signed)
OK to shower. OK for routine/light activities.  Diet as comfortable I will remove the staples on Friday, 20 NOV.  Call [phone] for an appointment. Prescriptions for Oxycodone, Levothyroxine (thyroid replacement), and Calcium supplements We will help you get set up to see Dr. Buddy Duty, Endocrinologist who will help [place] with Radioactive Iodine. Keep a thin coat of antibiotic ointment on the wound until it is no longer scabby Call for breathing problems, signs of bleeding or infection. (also [phone])

## 2014-02-13 LAB — CALCIUM: Calcium: 8 mg/dL — ABNORMAL LOW (ref 8.4–10.5)

## 2014-02-13 MED ORDER — LORAZEPAM 0.5 MG PO TABS: 0.5000 mg | ORAL_TABLET | Freq: Four times a day (QID) | ORAL | Status: AC | PRN

## 2014-02-13 MED ORDER — LEVOTHYROXINE SODIUM 100 MCG PO TABS
100.0000 ug | ORAL_TABLET | Freq: Every day | ORAL | Status: AC
Start: 2014-02-13 — End: ?

## 2014-02-13 MED ORDER — OYSTER SHELL CALCIUM/D 500-200 MG-UNIT PO TABS: 2.0000 | ORAL_TABLET | Freq: Two times a day (BID) | ORAL | Status: AC

## 2014-02-13 MED ORDER — POLYETHYLENE GLYCOL 3350 17 G PO PACK: 17.0000 g | PACK | Freq: Every day | ORAL | Status: AC

## 2014-02-13 MED ORDER — OXYCODONE HCL 5 MG PO TABS: 5.0000 mg | ORAL_TABLET | ORAL | Status: AC | PRN

## 2014-02-13 NOTE — Discharge Summary (Signed)
  02/13/2014 9:01 AM  Randall Walsh [id number]  Post-Op Day 6, Discharge summart    Temp:  [97.8 F (36.6 C)-98.2 F (36.8 C)] 97.8 F (36.6 C) (11/18 0516) Pulse Rate:  [80-99] 88 (11/18 0516) Resp:  [17-18] 17 (11/18 0516) BP: (110-148)/(68-96) 110/69 mmHg (11/18 0516) SpO2:  [95 %-97 %] 97 % (11/18 0516),     Intake/Output Summary (Last 24 hours) at 02/13/14 0901 Last data filed at 02/12/14 1900  Gross per 24 hour  Intake    600 ml  Output      0 ml  Net    600 ml  JP drain: 25 ml  Results for orders placed or performed during the hospital encounter of 02/07/14 (from the past 24 hour(s))  Calcium     Status: Abnormal   Collection Time: 02/13/14  4:38 AM  Result Value Ref Range   Calcium 8.0 (L) 8.4 - 10.5 mg/dL   Path report showed multiple lymph nodes, extracapsular extension, lymphovascular invasion of primary tumors.  Tumor present at multiple margins.    SUBJECTIVE:  Pain slowly better.  Breathing, speaking, swallowing OK.    OBJECTIVE:  Voice raspy but stronger.  Neck flat.  Drains removed without difficulty.    IMPRESSION:  Satisfactory check.  Ca++ stable.  Drains out.  PLAN:  Discharge home.  Recommend Genetic counseling.  Radioactive Iodine treatment soon.  May need external beam radiation.  May need LEFT neck dissection.    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  Hypocalcemia Cond:  Breathing and voicing adequately.  Ca++ stable.  Swallowing well.  Advancing activity well.  Pain controlled. Recheck: 2 days for staple removal.  Dr. Buddy Duty for Radioactive Iodine preparation and treatment.  Genetic Counseling at [place] Rx's:  Oxycodone, Levothyroxine, OsCal, Miralax, Ativan Instructions written and given  Hospital course:  Underwent surgery on day of admission.  Intra  operative findings included tumor adherent to tracheal wall bilat with involvement of recurrent laryngeal nerve on both sides.  Extensive RIGHT neck adenopathy with occlusion of jugular vein, which was sacrificed. Documented LEFT neck adenopathy, not dissected.    Was observed overnight in intermediate care.  Ca++ monitoring reached nadir of 7.0 on POD 2, with no symptoms. Cared for on regular care unit thereafter. + MRSA nasal swab.   Had difficult to control pain, finally relieved with combination of Oxycodone and Naproxyn.  Had fairly large wound drainage which finally settled.  Drains removed on POD 6.  Advanced diet, activity comfortably fairly early.  RIGHT CN XI strong immediately.    Voice weak, airway sl compromised early, improved through the hospitalization.  Flex laryngoscopy on POD 1 showed limited motion on RIGHT, poor motion on LEFT.  Airway adequate.    Discharged to home and care of family on POD 6.  Need Endocrine consult for radioactive Iodine.  Needs genetic counseling at cancer center.  Will remove staples in 2 days.  May need external beam radiation.  May need staged LEFT neck dissection.    [name]

## 2014-02-14 NOTE — Progress Notes (Signed)
Discharge instructions reviewed with patient and patient's wife. Next dose due time for medications also reviewed. Patient able to answer teach back questions. Printed AVS and prescriptions given to patient. Patient discharged to home via wheelchair. Accompanied by family.

## 2014-02-25 ENCOUNTER — Other Ambulatory Visit: Payer: BC Managed Care – PPO

## 2014-02-25 ENCOUNTER — Encounter: Payer: Self-pay | Admitting: Genetic Counselor

## 2014-02-25 ENCOUNTER — Ambulatory Visit (HOSPITAL_BASED_OUTPATIENT_CLINIC_OR_DEPARTMENT_OTHER): Payer: BC Managed Care – PPO | Admitting: Genetic Counselor

## 2014-02-25 DIAGNOSIS — Z315 Encounter for genetic counseling: Secondary | ICD-10-CM

## 2014-02-25 DIAGNOSIS — C73 Malignant neoplasm of thyroid gland: Secondary | ICD-10-CM

## 2014-02-25 NOTE — Progress Notes (Signed)
REFERRING PROVIDER: [name], MD  PRIMARY PROVIDER:  Pcp Not In System  PRIMARY REASON FOR VISIT:  1. Thyroid cancer      HISTORY OF PRESENT ILLNESS:   Mr. Tsang, a 28 y.o. male, was seen for a [place] cancer genetics consultation at the request of Dr. Erik Obey due to a personal history of cancer.  Mr. Rumpf presents to clinic today to discuss the possibility of a hereditary predisposition to cancer, genetic testing, and to further clarify his future cancer risks, as well as potential cancer risks for family members.   CANCER HISTORY:   No history exists.    No history exists.     HISTORY OF PRESENT ILLNESS: In 2015, at the age of 71, Mr. Ringwald was diagnosed with papillary cancer of the thyroid. This was treated with thyroidectomy.  Mr. Garabedian reports no prior history of radiation exposure.  He comes to clinic with his mother, who is able to provide family history information.     Past Medical History  Diagnosis Date  . Hypertension   . Anxiety   . Depression   . Headache     migraines  . Cancer     thyroid cancer - papillary 2015    Past Surgical History  Procedure Laterality Date  . Radical neck dissection Right 02/07/2014    Procedure: RIGHT NECK DISSECTION;  Surgeon: [name], MD;  Location: [facility];  Service: ENT;  Laterality: Right;  . Thyroidectomy N/A 02/07/2014    Procedure: TOTAL THYROIDECTOMY;  Surgeon: [name], MD;  Location: [facility];  Service: ENT;  Laterality: N/A;    History   Social History  . Marital Status: Unknown    Spouse Name: N/A    Number of Children: 0  . Years of Education: N/A   Social History Main Topics  . Smoking status: Never Smoker   . Smokeless tobacco: Former Systems developer  . Alcohol Use: Yes     Comment: socially  . Drug Use: Yes    Special: Marijuana  . Sexual Activity: None   Other Topics Concern  . None   Social History Narrative     FAMILY HISTORY:  We obtained a detailed, 4-generation family history.   Significant diagnoses are listed below: Family History  Problem Relation Age of Onset  . Hypertension Mother   . Hypertension Father   . CVA Father    Mr. Attwood comes from a large family, but there is no family history of cancer.  His family is from [place], and several family members were "killed in the jungle" or became sick and they were too poor to call a doctor, so it is unknown from which they died.   Patient's maternal ancestors are of [place] descent, and paternal ancestors are of [place] descent. There is no reported Ashkenazi Jewish ancestry. There is no known consanguinity.  GENETIC COUNSELING ASSESSMENT: Torell Minder is a 28 y.o. male with a personal history of aggressive papillary thyroid cancer which somewhat suggestive of a hereditary predisposition to cancer; however, it could also be sporadic. We, therefore, discussed and recommended the following at today's visit.   DISCUSSION:  We discussed that most thyroid cancer is sporadic and not inherited.  We also discussed that papillary thyroid cancer, while the most common, is less likely to be hereditary. Typically, inherited conditions are associated with follicular or medullary thyroid cancer. However, there are several hereditary conditions that can present with papillary thyroid cancer and based on his age and aggressive form of  cancer, we should consider testing.  We reviewed the characteristics, features and inheritance patterns of hereditary cancer syndromes. We also discussed genetic testing, including the appropriate family members to test, the process of testing, insurance coverage and turn-around-time for results. We discussed the implications of a negative, positive and/or variant of uncertain significant result. We recommended Mr. Roskelley pursue genetic testing for the [employer] 7 gene thyroid cancer gene panel.   PLAN: After considering the risks, benefits, and limitations,Mr. Cohick  provided informed consent to pursue genetic testing and  the blood sample was sent to [facility] for analysis of the Thyroid cancer panel. Results should be available within approximately 3-4 weeks' time, at which point they will be disclosed by telephone to Mr. Dershem, as will any additional recommendations warranted by these results. Mr. Wilber will receive a summary of his genetic counseling visit and a copy of his results once available. This information will also be available in Epic. We encouraged Mr. Fiorella to remain in contact with cancer genetics annually so that we can continuously update the family history and inform him of any changes in cancer genetics and testing that may be of benefit for his family. Mr. Edmister questions were answered to his satisfaction today. Our contact information was provided should additional questions or concerns arise.  Lastly, we encouraged Mr. Conery to remain in contact with cancer genetics annually so that we can continuously update the family history and inform him of any changes in cancer genetics and testing that may be of benefit for this family.   Mr.  Cumbie questions were answered to his satisfaction today. Our contact information was provided should additional questions or concerns arise. Thank you for the referral and allowing [place] to share in the care of your patient.   Karen P. Florene Glen, [place], [facility] Certified Genetic Counselor [email or web address] phone: [phone]  The patient was seen for a total of 30 minutes in face-to-face genetic counseling.  This patient was discussed with Drs. Magrinat, Lindi Adie and/or Burr Medico who agrees with the above.    _______________________________________________________________________ For Office Staff:  Number of people involved in session: 2 Was an Intern/ student involved with case: no

## 2014-03-04 ENCOUNTER — Other Ambulatory Visit: Payer: Self-pay | Admitting: Internal Medicine

## 2014-03-04 DIAGNOSIS — C73 Malignant neoplasm of thyroid gland: Secondary | ICD-10-CM

## 2014-03-05 ENCOUNTER — Ambulatory Visit
Admission: RE | Admit: 2014-03-05 | Discharge: 2014-03-05 | Disposition: A | Discharge status: Home / Self Care | Payer: BC Managed Care – PPO | Source: Ambulatory Visit | Attending: Internal Medicine | Admitting: Internal Medicine

## 2014-03-05 DIAGNOSIS — C73 Malignant neoplasm of thyroid gland: Secondary | ICD-10-CM

## 2014-03-19 ENCOUNTER — Other Ambulatory Visit (HOSPITAL_COMMUNITY): Payer: Self-pay | Admitting: Internal Medicine

## 2014-03-19 DIAGNOSIS — C73 Malignant neoplasm of thyroid gland: Secondary | ICD-10-CM

## 2014-03-25 ENCOUNTER — Encounter: Payer: Self-pay | Admitting: Genetic Counselor

## 2014-03-25 ENCOUNTER — Telehealth: Payer: Self-pay | Admitting: Genetic Counselor

## 2014-03-25 DIAGNOSIS — Z1379 Encounter for other screening for genetic and chromosomal anomalies: Secondary | ICD-10-CM | POA: Insufficient documentation

## 2014-03-25 NOTE — Progress Notes (Signed)
HPI: Mr. Crooker was previously seen in the [place] clinic due to a personal history of aggressive papillary thyroid cancer and concerns regarding a hereditary predisposition to cancer. Please refer to our prior cancer genetics clinic note for more information regarding Mr. Hernan medical, social and family histories, and our assessment and recommendations, at the time. Mr. Misner recent genetic test results were disclosed to him, as were recommendations warranted by these results. These results and recommendations are discussed in more detail below.  GENETIC TEST RESULTS: At the time of Mr. Hyland visit, we recommended he pursue genetic testing of the Thyroid cancer gene panel. This test, which included sequencing and deletion/duplication analysis of the following genes:  APC, CHEK2, DICER1, PRKAR1A, PTEN, RET and TP53.  The report date is March 20, 2014.  Testing was performed at [facility]. Genetic testing was normal, and did not reveal a deleterious mutation in these genes. The test report has been scanned into EPIC and is located under the Media tab. Mr. Bozard was instructed how to log onto the [facility] website to request a copy of his test report.  We discussed with Mr. Verno that since the current genetic testing is not perfect, it is possible there may be a gene mutation in one of these genes that current testing cannot detect, but that chance is small. We also discussed, that it is possible that another gene that has not yet been discovered, or that we have not yet tested, is responsible for the cancer diagnoses in the family, and it is, therefore, important to remain in touch with cancer genetics in the future so that we can continue to offer Mr. Riemenschneider the most up to date genetic testing.   CANCER SCREENING RECOMMENDATIONS: This result is reassuring and suggests that Mr. Kozub cancer was most likely not due to an inherited predisposition associated with  one of these genes. Most cancers happen by chance and this negative test, along with details of his family history, suggests that his cancer falls into this category. We, therefore, recommended he continue to follow the cancer management and screening guidelines provided by his oncology and primary providers.   RECOMMENDATIONS FOR FAMILY MEMBERS: Women in this family might be at some increased risk of developing cancer, over the general population risk, simply due to the family history of cancer. We recommended women in this family have a yearly mammogram beginning at age 1, an an annual clinical breast exam, and perform monthly breast self-exams. Women in this family should also have a gynecological exam as recommended by their primary provider. All family members should have a colonoscopy by age 40.  FOLLOW-UP: Lastly, we discussed with Mr. Kalman that cancer genetics is a rapidly advancing field and it is possible that new genetic tests will be appropriate for him and/or his family members in the future. We encouraged him to remain in contact with cancer genetics on an annual basis so we can update his personal and family histories and let him know of advances in cancer genetics that may benefit this family.   Our contact number was provided. Mr.. Alipio questions were answered to his satisfaction, and she knows he is welcome to call us at anytime with additional questions or concerns.   Roma Kayser, MS, [facility] Certified Genetic Counselor [email or web address]

## 2014-03-25 NOTE — Telephone Encounter (Signed)
Revealed that his thyroid cancer genetic testing panel was negative.  Therefore, based on the testing of these genes (seen in Problem list) he does not have a hereditary cause for his thyroid cancer.

## 2014-04-01 ENCOUNTER — Other Ambulatory Visit (HOSPITAL_COMMUNITY): Payer: Self-pay | Admitting: Internal Medicine

## 2014-04-01 DIAGNOSIS — C73 Malignant neoplasm of thyroid gland: Secondary | ICD-10-CM

## 2014-04-08 ENCOUNTER — Ambulatory Visit (HOSPITAL_COMMUNITY)
Admission: RE | Admit: 2014-04-08 | Discharge: 2014-04-08 | Disposition: A | Discharge status: Home / Self Care | Payer: BLUE CROSS/BLUE SHIELD | Source: Ambulatory Visit | Attending: Internal Medicine | Admitting: Internal Medicine

## 2014-04-08 DIAGNOSIS — C73 Malignant neoplasm of thyroid gland: Secondary | ICD-10-CM

## 2014-04-08 MED ORDER — SODIUM IODIDE I 131 CAPSULE
3.0000 | Freq: Once | INTRAVENOUS | Status: AC | PRN
  Administered 2014-04-08: 3 via ORAL

## 2014-04-11 ENCOUNTER — Ambulatory Visit (HOSPITAL_COMMUNITY)
Admission: RE | Admit: 2014-04-11 | Discharge: 2014-04-11 | Disposition: A | Discharge status: Home / Self Care | Payer: BLUE CROSS/BLUE SHIELD | Source: Ambulatory Visit | Attending: Internal Medicine | Admitting: Internal Medicine

## 2014-04-11 DIAGNOSIS — C73 Malignant neoplasm of thyroid gland: Secondary | ICD-10-CM | POA: Diagnosis not present

## 2014-04-18 ENCOUNTER — Ambulatory Visit (HOSPITAL_COMMUNITY)
Admission: RE | Admit: 2014-04-18 | Discharge: 2014-04-18 | Disposition: A | Discharge status: Home / Self Care | Payer: BLUE CROSS/BLUE SHIELD | Source: Ambulatory Visit | Attending: Internal Medicine | Admitting: Internal Medicine

## 2014-04-18 DIAGNOSIS — C73 Malignant neoplasm of thyroid gland: Secondary | ICD-10-CM | POA: Diagnosis present

## 2014-04-18 DIAGNOSIS — C779 Secondary and unspecified malignant neoplasm of lymph node, unspecified: Secondary | ICD-10-CM | POA: Insufficient documentation

## 2014-04-18 MED ORDER — SODIUM IODIDE I 131 CAPSULE
125.0000 | Freq: Once | INTRAVENOUS | Status: AC | PRN
Start: 2014-04-18 — End: 2014-04-18
  Administered 2014-04-18: 133 via ORAL

## 2014-04-19 ENCOUNTER — Ambulatory Visit (HOSPITAL_COMMUNITY): Admission: RE | Admit: 2014-04-19 | Payer: BLUE CROSS/BLUE SHIELD | Source: Ambulatory Visit

## 2014-04-26 ENCOUNTER — Ambulatory Visit: Payer: BLUE CROSS/BLUE SHIELD | Attending: Otolaryngology

## 2014-04-26 DIAGNOSIS — R293 Abnormal posture: Secondary | ICD-10-CM

## 2014-04-26 DIAGNOSIS — M7581 Other shoulder lesions, right shoulder: Secondary | ICD-10-CM | POA: Diagnosis present

## 2014-04-26 DIAGNOSIS — R29898 Other symptoms and signs involving the musculoskeletal system: Secondary | ICD-10-CM | POA: Diagnosis not present

## 2014-04-26 DIAGNOSIS — M25611 Stiffness of right shoulder, not elsewhere classified: Secondary | ICD-10-CM

## 2014-04-27 NOTE — Therapy (Addendum)
[facility] [address] [place], [place], [address] Phone: [phone]   Fax:  [phone]  Physical Therapy Evaluation  Patient Details  Name: Randall Walsh MRN: [id number] Date of Birth: 08/08/1985 Referring Provider:  [name], MD  Encounter Date: 04/26/2014      PT End of Session - 04/26/14 1217    Visit Number 1   Number of Visits 17   Date for PT Re-Evaluation 06/25/14   Authorization Type BCBS 20 visit limit   Authorization - Visit Number 1   Authorization - Number of Visits 20   PT Start Time 9678  pt late   PT Stop Time 1105   PT Time Calculation (min) 42 min      Past Medical History  Diagnosis Date  . Hypertension   . Anxiety   . Depression   . Headache     migraines  . Cancer     thyroid cancer - papillary 2015    Past Surgical History  Procedure Laterality Date  . Radical neck dissection Right 02/07/2014    Procedure: RIGHT NECK DISSECTION;  Surgeon: [name], MD;  Location: [facility];  Service: ENT;  Laterality: Right;  . Thyroidectomy N/A 02/07/2014    Procedure: TOTAL THYROIDECTOMY;  Surgeon: [name], MD;  Location: [facility];  Service: ENT;  Laterality: N/A;    There were no vitals taken for this visit.  Visit Diagnosis:  Decreased shoulder mobility, right - Plan: PT PLAN OF CARE CERT/RE-CERT  Decreased ROM of neck - Plan: PT PLAN OF CARE CERT/RE-CERT  Weakness of right shoulder - Plan: PT PLAN OF CARE CERT/RE-CERT  Posture abnormality - Plan: PT PLAN OF CARE CERT/RE-CERT      Subjective Assessment - 04/26/14 1036    Symptoms Pt had a right thyroid resection due to thyroid cancer in November 2015. He has been out of work since then as his work does not offer light duty opportunities. He has been noticing a decrease in pain since the surgery and has been actively performing neck and shoulder strengthening and stretching exercises that his brother has taught him (pt's brother  has had some occupational therapy training).   Pertinent History anxiety, depression, hypertension          [facility] PT Assessment - 04/27/14 0001    Assessment   Medical Diagnosis metastasis from thyroid cancer/thyroidectomy   Onset Date --  01/2014   Prior Therapy none   Precautions   Precautions None   Balance Screen   Has the patient fallen in the past 6 months No   Has the patient had a decrease in activity level because of a fear of falling?  No   Is the patient reluctant to leave their home because of a fear of falling?  No   Home Environment   Living Enviornment Private residence   Living Arrangements Parent   Available Help at Discharge Family   Type of [place] to enter   Entrance Stairs-Number of Steps 4   Entrance Stairs-Rails None   Prior Function   Level of Independence Independent with basic ADLs;Independent with gait;Independent with transfers   Vocation Full time employment   Vocation Requirements --  drives a forklift, also requires heavy lifting   Leisure biking, weight lifting, cutting hair   Cognition   Overall Cognitive Status Within Functional Limits for tasks assessed   Observation/Other Assessments   Skin Integrity swelling of right side of neck,  scar is pink and appears to be healing well   Focus on Therapeutic Outcomes (FOTO)  not appropriate for this patient diagnosis   Posture/Postural Control   Posture/Postural Control Postural limitations   Postural Limitations Rounded Shoulders;Forward head;Posterior pelvic tilt;Flexed trunk  right shoulder is depressed   AROM   Right Shoulder Flexion --  WNL   Right Shoulder ABduction 80 Degrees   Right Shoulder Internal Rotation --  achieves full range but challenging   Right Shoulder External Rotation --  achieves full range but delayed and challenging   Cervical Flexion WNL   Cervical Extension WNL   Cervical - Right Side Bend limited   Cervical - Left Side Bend limited  greater than R   Cervical - Right Rotation 30   Cervical - Left Rotation 21   Palpation   Palpation pt does not experience point tenderness, numbness is present around scar, scar is dense/hard   Ambulation/Gait   Ambulation/Gait Yes   Ambulation/Gait Assistance 7: Independent  also able to turn head (limited AROM) without staggering   Ambulation Distance (Feet) 150 Feet   Assistive device None   Gait Pattern Within Functional Limits  decreased R arm swing, decreased trunk rotation   Ambulation Surface Level;Indoor   Gait velocity 3.03 ft/sec                          PT Education - 04/26/14 1215    Education provided Yes   Education Details verbally provided tips and ideas for HEP based on evaluation today as well as education regarding appropriate frequency/duration of stretches for muscle length growth. Will give typed HEP next visit   Person(s) Educated Patient   Methods Explanation;Demonstration   Comprehension Verbalized understanding          PT Short Term Goals - 04/26/14 1225    PT SHORT TERM GOAL #1   Title Demonstrate independence with HEP. Target 05/24/14   PT SHORT TERM GOAL #2   Title Increase cervical rotation to 35 deg on right and 27 degrees on left. Target 05/24/14   PT SHORT TERM GOAL #3   Title Increase right shoulder abduction to 95 degrees. Target 05/24/14   PT SHORT TERM GOAL #4   Title Demonstrate adequate trunk rotation with ambulation. Target 05/24/14           PT Long Term Goals - 04/26/14 1227    PT LONG TERM GOAL #1   Title Report ability to perform light bench pressing without valsalva (pt's reported goal for therapy). 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 Target 05/24/14   PT LONG TERM GOAL #3   Title Increase R cervical rotation to 45 degrees and left cervical rotation to 37 degrees for improved safety  due to increaesd ability to scan environment while driving or bike riding. Target 05/24/14   PT LONG TERM GOAL #4   Title Demonstrate independence with scar massage. 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 In November 2015 he had resection of the thyroid and now presents with decrased muscle length of neck and shoulder  musculature as well as muscle weaness. The surgery also had a negative effect on posture. Swelling is still present. Pt will benefit from skilled PT services to increase strength and range of motion of shoulder and neck for improved safety with driving, increased liklihood of return to work, and increased quality of life.   Rehab Potential Good   PT Frequency 2x / week   PT Duration 8 weeks   PT Treatment/Interventions ADLs/Self Care Home Management;Therapeutic activities;Patient/family education;Scar mobilization;Therapeutic exercise;Gait training;Manual techniques;Manual lymph drainage;Neuromuscular re-education   PT Next Visit Plan Provide HEP to include stretching and strengthening of : traps, scalenes, sternocleidomastoid, shoulder abductors, strengthening of rhomboids, give instructions for scar massage, perform lymph drainage massage if appropriate.         Problem List Patient Active Problem List   Diagnosis Date Noted  . Genetic testing 03/25/2014  . Thyroid cancer 02/07/2014    Delrae Sawyers D 04/27/2014, 1:35 PM  [facility] [address] [place], [place], [address] Phone: [phone]   Fax:  [phone]

## 2014-04-29 ENCOUNTER — Ambulatory Visit (HOSPITAL_COMMUNITY)
Admission: RE | Admit: 2014-04-29 | Discharge: 2014-04-29 | Disposition: A | Discharge status: Home / Self Care | Payer: BLUE CROSS/BLUE SHIELD | Source: Ambulatory Visit | Attending: Internal Medicine | Admitting: Internal Medicine

## 2014-04-29 ENCOUNTER — Ambulatory Visit: Payer: BLUE CROSS/BLUE SHIELD | Attending: Otolaryngology | Admitting: Physical Therapy

## 2014-04-29 ENCOUNTER — Encounter: Payer: Self-pay | Admitting: Physical Therapy

## 2014-04-29 DIAGNOSIS — R293 Abnormal posture: Secondary | ICD-10-CM | POA: Diagnosis not present

## 2014-04-29 DIAGNOSIS — R29898 Other symptoms and signs involving the musculoskeletal system: Secondary | ICD-10-CM | POA: Diagnosis not present

## 2014-04-29 DIAGNOSIS — M25611 Stiffness of right shoulder, not elsewhere classified: Secondary | ICD-10-CM

## 2014-04-29 DIAGNOSIS — C73 Malignant neoplasm of thyroid gland: Secondary | ICD-10-CM

## 2014-04-29 DIAGNOSIS — M7581 Other shoulder lesions, right shoulder: Secondary | ICD-10-CM | POA: Diagnosis not present

## 2014-04-29 NOTE — Patient Instructions (Signed)
AROM: Neck Rotation   Lying on back: Turn head slowly to look toward left shoulder. Hold each position __20-30__ seconds. Repeat __3-6__ times per set. Do ___1_ sets per session. Do _1-2___ sessions per day.  http://orth.exer.us/294    Head Press With Cisco on back, press head down into pillows without looking up/down. Hold 5 seconds. 10 reps. 1-2 times a day.  Copyright  VHI. All rights reserved.  AROM: Lateral Neck Flexion   Slowly tilt head towardleft shoulder. Hold each position _10-20___ seconds. Repeat __3-6__ times per set. Do __1__ sets per session. Do 1-2____ sessions per day.  http://orth.exer.us/296   Copyright  VHI. All rights reserved.  Scapular Retraction (Standing)   With arms at sides, pinch shoulder blades together. Hold for 5 seconds. Repeat _10___ times per set. Do __1__ sets per session. Do _1-2___ sessions per day.  http://orth.exer.us/944   Copyright  VHI. All rights reserved.  Shoulder Circle Shrug   Bring shoulders up and rotate around backward. Repeat __10__ times. Do ___1-2_ sessions per day.  http://gt2.exer.us/14   Copyright  VHI. All rights reserved.

## 2014-04-30 NOTE — Therapy (Signed)
[facility] [address] [place], [place], [address] Phone: [phone]   Fax:  [phone]  Physical Therapy Treatment  Patient Details  Name: Randall Walsh MRN: [id number] Date of Birth: 1985-07-27 Referring Provider:  [name], MD  Encounter Date: 04/29/2014      PT End of Session - 04/30/14 1214    Visit Number 2   Number of Visits 17   Date for PT Re-Evaluation 06/25/14   Authorization Type BCBS 20 visit limit   Authorization - Visit Number 2   Authorization - Number of Visits 20   PT Start Time 7341   PT Stop Time 1149   PT Time Calculation (min) 46 min   Activity Tolerance Patient tolerated treatment well   Behavior During Therapy [facility] for tasks assessed/performed      Past Medical History  Diagnosis Date  . Hypertension   . Anxiety   . Depression   . Headache     migraines  . Cancer     thyroid cancer - papillary 2015    Past Surgical History  Procedure Laterality Date  . Radical neck dissection Right 02/07/2014    Procedure: RIGHT NECK DISSECTION;  Surgeon: [name], MD;  Location: [facility];  Service: ENT;  Laterality: Right;  . Thyroidectomy N/A 02/07/2014    Procedure: TOTAL THYROIDECTOMY;  Surgeon: [name], MD;  Location: [facility];  Service: ENT;  Laterality: N/A;    There were no vitals taken for this visit.  Visit Diagnosis:  Decreased shoulder mobility, right  Decreased ROM of neck  Weakness of right shoulder  Posture abnormality      Subjective Assessment - 04/29/14 1107    Symptoms (p) No new complaints or pain to report.   Currently in Pain? (p) No/denies     Treatment: Manual Therapy: - manual lymph drainage to cervical and shoulder area's directing flow toward lymph clusters of subclavicular, axillary and scapular areas via Leduc method/manuever's.   - scar massage x 7-8 minutes. Pt's girl friend taught massage methods for carryover at home.  Exercise: - supine  cervical rotation to left for stretching, chin tucks for HEP.  - seated cervical head tilt (lateral flexion) toward left for stretching, posterior shoulder rolls and scapular retraction for stretching also for HEP.        PT Education - 04/30/14 1213    Education provided Yes   Education Details written HEP provided for cervical stretching; scar massage techniques   Person(s) Educated Patient;Other (comment)  girl friend   Methods Explanation;Demonstration;Handout   Comprehension Verbalized understanding;Returned demonstration          PT Short Term Goals - 04/26/14 1225    PT SHORT TERM GOAL #1   Title Demonstrate independence with HEP. Target 05/24/14   PT SHORT TERM GOAL #2   Title Increase cervical rotation to 35 deg on right and 27 degrees on left. Target 05/24/14   PT SHORT TERM GOAL #3   Title Increase right shoulder abduction to 95 degrees. Target 05/24/14   PT SHORT TERM GOAL #4   Title Demonstrate adequate trunk rotation with ambulation. Target 05/24/14           PT Long Term Goals - 04/26/14 1227    PT LONG TERM GOAL #1   Title Report ability to perform light bench pressing without valsalva (pt's reported goal for therapy). 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 Target 05/24/14   PT LONG TERM GOAL #3   Title Increase R cervical rotation to 45 degrees and left cervical rotation to 37 degrees for improved safety due to increaesd ability to scan environment while driving or bike riding. Target 05/24/14   PT LONG TERM GOAL #4   Title Demonstrate independence with scar massage. Target 05/24/14           Plan - 04/30/14 1214    Clinical Impression Statement Initiated manual lymph drainage for decreased edema and scar massage for increased tissue mobility at the scar sight. Issued HEP for cervical stretching.    Rehab Potential Good   PT  Frequency 2x / week   PT Duration 8 weeks   PT Treatment/Interventions ADLs/Self Care Home Management;Therapeutic activities;Patient/family education;Scar mobilization;Therapeutic exercise;Gait training;Manual techniques;Manual lymph drainage;Neuromuscular re-education   PT Next Visit Plan Continue with manual techniques and stretching. Begin isometric strenthening as tolerated.        Problem List Patient Active Problem List   Diagnosis Date Noted  . Genetic testing 03/25/2014  . Thyroid cancer 02/07/2014    [name] 04/30/2014, 12:18 PM  [name], PTA, [facility] [address] [place], [place] [address] [phone] 04/30/2014, 12:18 PM

## 2014-05-01 ENCOUNTER — Ambulatory Visit: Payer: BLUE CROSS/BLUE SHIELD

## 2014-05-01 DIAGNOSIS — M25611 Stiffness of right shoulder, not elsewhere classified: Secondary | ICD-10-CM

## 2014-05-01 DIAGNOSIS — R293 Abnormal posture: Secondary | ICD-10-CM

## 2014-05-01 DIAGNOSIS — M7581 Other shoulder lesions, right shoulder: Secondary | ICD-10-CM | POA: Diagnosis not present

## 2014-05-01 DIAGNOSIS — R29898 Other symptoms and signs involving the musculoskeletal system: Secondary | ICD-10-CM

## 2014-05-01 NOTE — Therapy (Signed)
[facility] [address] [place], [place], [address] Phone: [phone]   Fax:  [phone]  Physical Therapy Treatment  Patient Details  Name: Randall Walsh MRN: [id number] Date of Birth: 12-18-1985 Referring Provider:  [name], MD  Encounter Date: 05/01/2014      PT End of Session - 05/01/14 2305    Visit Number 3   Number of Visits 17   Date for PT Re-Evaluation 06/25/14   Authorization Type BCBS 20 visit limit   Authorization - Visit Number 3   Authorization - Number of Visits 20   PT Start Time 6962   PT Stop Time 1401   PT Time Calculation (min) 43 min      Past Medical History  Diagnosis Date  . Hypertension   . Anxiety   . Depression   . Headache     migraines  . Cancer     thyroid cancer - papillary 2015    Past Surgical History  Procedure Laterality Date  . Radical neck dissection Right 02/07/2014    Procedure: RIGHT NECK DISSECTION;  Surgeon: [name], MD;  Location: [facility];  Service: ENT;  Laterality: Right;  . Thyroidectomy N/A 02/07/2014    Procedure: TOTAL THYROIDECTOMY;  Surgeon: [name], MD;  Location: [facility];  Service: ENT;  Laterality: N/A;    There were no vitals taken for this visit.  Visit Diagnosis:  Decreased shoulder mobility, right  Decreased ROM of neck  Weakness of right shoulder  Posture abnormality  Therex: -Reviewed HEP from previous session with correct performance.  -taught and provided isometric neck strengthening HEP and pt performed each exercise as indicated on handout  Manual therapy: -Scar massage, gentle cross friction and parallel to scar  -upper cervical distraction, then distraction with PROM of neck, then with AROM without c/o pain with pt achieving full AAROM of head and neck. Initially pt c/o scalp pain with this, but therapist provided continued distraction and gentle massage of scalenes, sternocleido mastoid which entirely relieved the  pain.                      PT Education - 05/01/14 2304    Education Details HEP for isometric neck strengthening   Person(s) Educated Patient   Methods Explanation;Demonstration;Handout   Comprehension Verbalized understanding;Returned demonstration          PT Short Term Goals - 04/26/14 1225    PT SHORT TERM GOAL #1   Title Demonstrate independence with HEP. Target 05/24/14   PT SHORT TERM GOAL #2   Title Increase cervical rotation to 35 deg on right and 27 degrees on left. Target 05/24/14   PT SHORT TERM GOAL #3   Title Increase right shoulder abduction to 95 degrees. Target 05/24/14   PT SHORT TERM GOAL #4   Title Demonstrate adequate trunk rotation with ambulation. Target 05/24/14           PT Long Term Goals - 04/26/14 1227    PT LONG TERM GOAL #1   Title Report ability to perform light bench pressing without valsalva (pt's reported goal for therapy). 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 Target 05/24/14   PT LONG TERM GOAL #3   Title Increase R cervical rotation to 45 degrees and left cervical rotation to 37 degrees for improved safety due  to increaesd ability to scan environment while driving or bike riding. Target 05/24/14   PT LONG TERM GOAL #4   Title Demonstrate independence with scar massage. Target 05/24/14               Plan - 05/01/14 2306    Clinical Impression Statement Pt demonstrated significant improvement in cervical AROM in all direction following yesterday's session and after manual therapy today. Expected to make rapid progres with continued skilled therapy.   PT Next Visit Plan continue manual techniques; strengthen shoulder        Problem List Patient Active Problem List   Diagnosis Date Noted  . Genetic testing 03/25/2014  . Thyroid cancer 02/07/2014    Delrae Sawyers D 05/01/2014, 11:18  PM  [facility] [address] [place], [place], [address] Phone: [phone]   Fax:  [phone]

## 2014-05-01 NOTE — Patient Instructions (Signed)
Decompression Exercise: Neck Support   Lie on back on firm surface, knees bent, feet flat, arms turned up, out to sides, backs of hands down. Support under neck: towel. Time 3-5 minutes. Perform this before stretching.    Copyright  VHI. All rights reserved.  Strengthening: Flexion - Resisted   Facing forward, fingertips on forehead, bend head forward. Give light resistance to hold your head in this position with active muscles. Hold 5 seconds. Do 5 sets per session. Do 1-2 sessions per day.  http://orth.exer.us/328   Copyright  VHI. All rights reserved.  Strengthening: Rotation - Resisted   Facing forward with fingertips on right temple, turn head to that side. Give light resistance to hold your head in this position with active muscles. Hold 5 seconds. Do 5 sets per session. Do 1-2 sessions per day. Perform toward right and left. http://orth.exer.us/324   Copyright  VHI. All rights reserved.  Strengthening: Lateral Flexion - Resisted, Beginning to End Range   Facing forward, fingertips on right temple, head side bent away, tilt head back toward other shoulder. Give light resistance to hold your head in this position with active muscles. Hold 5 seconds. Do 5 sets per session. Do 1-2 sessions per day toward right and left. http://orth.exer.us/334   Copyright  VHI. All rights reserved.  Strengthening: Extension - Resisted   Facing forward, fingertips on back of head, bend head backward. Give light resistance. Give light resistance to hold your head in this position with active muscles. Hold 5 seconds. Do 5 sets per session. Do 1-2 sessions per day. http://orth.exer.us/330   Copyright  VHI. All rights reserved.

## 2014-05-06 ENCOUNTER — Ambulatory Visit: Payer: BLUE CROSS/BLUE SHIELD

## 2014-05-06 DIAGNOSIS — M25611 Stiffness of right shoulder, not elsewhere classified: Secondary | ICD-10-CM

## 2014-05-06 DIAGNOSIS — R293 Abnormal posture: Secondary | ICD-10-CM

## 2014-05-06 DIAGNOSIS — R29898 Other symptoms and signs involving the musculoskeletal system: Secondary | ICD-10-CM

## 2014-05-06 DIAGNOSIS — M7581 Other shoulder lesions, right shoulder: Secondary | ICD-10-CM | POA: Diagnosis not present

## 2014-05-06 NOTE — Patient Instructions (Signed)
Practice daily 3x15 seconds multiple sessions throughout the day, holding air behind your cheeks to puff your cheeks up and stretch them. This will help to decrease the facial pain you described.

## 2014-05-06 NOTE — Therapy (Signed)
[facility] [address] [place], [place], [address] Phone: [phone]   Fax:  [phone]  Physical Therapy Treatment  Patient Details  Name: Randall Walsh MRN: [id number] Date of Birth: 03/22/1986 Referring Provider:  [name], MD  Encounter Date: 05/06/2014      PT End of Session - 05/06/14 1713    Visit Number 4   Number of Visits 17   Date for PT Re-Evaluation 06/25/14   Authorization Type BCBS 20 visit limit   Authorization - Visit Number 4   Authorization - Number of Visits 20   PT Start Time 0932   PT Stop Time 1013   PT Time Calculation (min) 41 min      Past Medical History  Diagnosis Date  . Hypertension   . Anxiety   . Depression   . Headache     migraines  . Cancer     thyroid cancer - papillary 2015    Past Surgical History  Procedure Laterality Date  . Radical neck dissection Right 02/07/2014    Procedure: RIGHT NECK DISSECTION;  Surgeon: [name], MD;  Location: [facility];  Service: ENT;  Laterality: Right;  . Thyroidectomy N/A 02/07/2014    Procedure: TOTAL THYROIDECTOMY;  Surgeon: [name], MD;  Location: [facility];  Service: ENT;  Laterality: N/A;    There were no vitals taken for this visit.  Visit Diagnosis:  Decreased shoulder mobility, right  Decreased ROM of neck  Weakness of right shoulder  Posture abnormality      Subjective Assessment - 05/06/14 0934    Symptoms Pt reports that the sharp pains on the right side of the face and head are still occurring but improving. They last about 30 seconds and occur most when he turns his head toward the left.   Currently in Pain? No/denies       Manual therapy:  See manual therapy from previous session. Same techniques used with addition of more manual lymph drainage, and active assisted sterno-cleido-mastoid stretching.  Therex: -Buccinator+masseter stretch performed 5x15 seconds -shoulder active assisted abduction  without weight initially with RUE able to attain full range of motion. Progressed to perform with 5# hand weight with good performance.  -shoulder overhead presses with 5# hand weight (pt reports he does this with 35# of weight); trialed briefly with 10# and pt demonstrated excessive compensatory patterns. Pt was recommended to only use 5# weight and to progress by 1# per week with this.                        PT Short Term Goals - 04/26/14 1225    PT SHORT TERM GOAL #1   Title Demonstrate independence with HEP. Target 05/24/14   PT SHORT TERM GOAL #2   Title Increase cervical rotation to 35 deg on right and 27 degrees on left. Target 05/24/14   PT SHORT TERM GOAL #3   Title Increase right shoulder abduction to 95 degrees. Target 05/24/14   PT SHORT TERM GOAL #4   Title Demonstrate adequate trunk rotation with ambulation. Target 05/24/14           PT Long Term Goals - 04/26/14 1227    PT LONG TERM GOAL #1   Title Report ability to perform light bench pressing without valsalva (pt's reported goal for therapy). 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 Target 05/24/14   PT LONG TERM GOAL #3   Title Increase R cervical rotation to 45 degrees and left cervical rotation to 37 degrees for improved safety due to increaesd ability to scan environment while driving or bike riding. Target 05/24/14   PT LONG TERM GOAL #4   Title Demonstrate independence with scar massage. Target 05/24/14               Plan - 05/06/14 1713    Clinical Impression Statement Pt demonstrates improved R shoulder AAROM and AROM. Expected to continue to progress.   PT Next Visit Plan continue manual techniques; strengthen shoulder and neck, stretch facial muscles        Problem List Patient Active Problem List   Diagnosis Date Noted  . Genetic testing 03/25/2014  . Thyroid  cancer 02/07/2014    Delrae Sawyers D 05/06/2014, 5:15 PM  [facility] [address] [place], [place], [address] Phone: [phone]   Fax:  [phone]

## 2014-05-08 ENCOUNTER — Ambulatory Visit: Payer: BLUE CROSS/BLUE SHIELD

## 2014-05-08 DIAGNOSIS — R293 Abnormal posture: Secondary | ICD-10-CM

## 2014-05-08 DIAGNOSIS — M7581 Other shoulder lesions, right shoulder: Secondary | ICD-10-CM | POA: Diagnosis not present

## 2014-05-08 DIAGNOSIS — M25611 Stiffness of right shoulder, not elsewhere classified: Secondary | ICD-10-CM

## 2014-05-08 DIAGNOSIS — R29898 Other symptoms and signs involving the musculoskeletal system: Secondary | ICD-10-CM

## 2014-05-08 NOTE — Therapy (Signed)
[facility] [address] [place], [place], [address] Phone: [phone]   Fax:  [phone]  Physical Therapy Treatment  Patient Details  Name: Randall Walsh MRN: [id number] Date of Birth: May 03, 1985 Referring Provider:  [name], MD  Encounter Date: 05/08/2014      PT End of Session - 05/08/14 1035    Visit Number 5   Number of Visits 17   Date for PT Re-Evaluation 06/25/14   Authorization Type BCBS 20 visit limit   Authorization - Visit Number 5   Authorization - Number of Visits 20   PT Start Time 0850   PT Stop Time 0945   PT Time Calculation (min) 55 min      Past Medical History  Diagnosis Date  . Hypertension   . Anxiety   . Depression   . Headache     migraines  . Cancer     thyroid cancer - papillary 2015    Past Surgical History  Procedure Laterality Date  . Radical neck dissection Right 02/07/2014    Procedure: RIGHT NECK DISSECTION;  Surgeon: [name], MD;  Location: [facility];  Service: ENT;  Laterality: Right;  . Thyroidectomy N/A 02/07/2014    Procedure: TOTAL THYROIDECTOMY;  Surgeon: [name], MD;  Location: [facility];  Service: ENT;  Laterality: N/A;    There were no vitals taken for this visit.  Visit Diagnosis:  Decreased shoulder mobility, right  Decreased ROM of neck  Weakness of right shoulder  Posture abnormality      Subjective Assessment - 05/08/14 0853    Symptoms Pt reports that his physician said he has a sinus infection and has put him on some medication.   Currently in Pain? No/denies  no pain, just stiffness     Manual lymph drainage of neck x40 minutes with pt educated on how to perform a modified version of this at home. Pt demonstrated correct performance of this. Visual difference noted in decreased swelling of the face and neck, and increased active range of motion of neck rotation noted upon gross assessment.  Therex: 20x standing shouder shrugs with  5# and mirror + tactile cues for correct form. 3x15 T's in against gravity postiion with 5# free weights Attempted U's against gravity but pt unable to generate the strength to perform this; downgraded to U's in standing with BUE and 5# free weights 3x15. Pt was verbally instructed to perform the T's with 5# only, at home as he did in therapy today. He verbalized understanding.                        PT Short Term Goals - 04/26/14 1225    PT SHORT TERM GOAL #1   Title Demonstrate independence with HEP. Target 05/24/14   PT SHORT TERM GOAL #2   Title Increase cervical rotation to 35 deg on right and 27 degrees on left. Target 05/24/14   PT SHORT TERM GOAL #3   Title Increase right shoulder abduction to 95 degrees. Target 05/24/14   PT SHORT TERM GOAL #4   Title Demonstrate adequate trunk rotation with ambulation. Target 05/24/14           PT Long Term Goals - 04/26/14 1227    PT LONG TERM GOAL #1   Title Report ability to perform light bench pressing without valsalva (pt's reported goal for therapy). 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 Target 05/24/14   PT LONG TERM GOAL #3   Title Increase R cervical rotation to 45 degrees and left cervical rotation to 37 degrees for improved safety due to increaesd ability to scan environment while driving or bike riding. Target 05/24/14   PT LONG TERM GOAL #4   Title Demonstrate independence with scar massage. Target 05/24/14               Plan - 05/08/14 1037    Clinical Impression Statement Pt reporting sensation that "my arm is going to fall off when I try to throw a frisbee"   PT Next Visit Plan continue manual lymph drainage, strengthen shoulder stabilizers        Problem List Patient Active Problem List   Diagnosis Date Noted  . Genetic testing 03/25/2014  . Thyroid cancer 02/07/2014    Delrae Sawyers, PT,DPT,NCS 05/08/2014 10:44 AM Phone [phone] FAX [phone]         [facility] [address] [place], [place], [address] Phone: [phone]   Fax:  [phone]

## 2014-05-13 ENCOUNTER — Ambulatory Visit: Payer: BLUE CROSS/BLUE SHIELD | Admitting: Physical Therapy

## 2014-05-15 ENCOUNTER — Ambulatory Visit: Payer: BLUE CROSS/BLUE SHIELD

## 2014-05-15 DIAGNOSIS — R29898 Other symptoms and signs involving the musculoskeletal system: Secondary | ICD-10-CM

## 2014-05-15 DIAGNOSIS — R293 Abnormal posture: Secondary | ICD-10-CM

## 2014-05-15 DIAGNOSIS — M7581 Other shoulder lesions, right shoulder: Secondary | ICD-10-CM | POA: Diagnosis not present

## 2014-05-15 DIAGNOSIS — M25611 Stiffness of right shoulder, not elsewhere classified: Secondary | ICD-10-CM

## 2014-05-15 NOTE — Therapy (Signed)
[facility] [address] [place], [place], [address] Phone: [phone]   Fax:  [phone]  Physical Therapy Treatment  Patient Details  Name: Randall Walsh MRN: [id number] Date of Birth: June 12, 1985 Referring Provider:  [name], MD  Encounter Date: 05/15/2014      PT End of Session - 05/15/14 1218    Visit Number 6   Number of Visits 17   Date for PT Re-Evaluation 06/25/14   Authorization Type BCBS 20 visit limit   Authorization - Visit Number 6   Authorization - Number of Visits 20   PT Start Time 0935   PT Stop Time 1017   PT Time Calculation (min) 42 min      Past Medical History  Diagnosis Date  . Hypertension   . Anxiety   . Depression   . Headache     migraines  . Cancer     thyroid cancer - papillary 2015    Past Surgical History  Procedure Laterality Date  . Radical neck dissection Right 02/07/2014    Procedure: RIGHT NECK DISSECTION;  Surgeon: [name], MD;  Location: [facility];  Service: ENT;  Laterality: Right;  . Thyroidectomy N/A 02/07/2014    Procedure: TOTAL THYROIDECTOMY;  Surgeon: [name], MD;  Location: [facility];  Service: ENT;  Laterality: N/A;    There were no vitals taken for this visit.  Visit Diagnosis:  Decreased shoulder mobility, right  Decreased ROM of neck  Weakness of right shoulder  Posture abnormality      Subjective Assessment - 05/15/14 0938    Symptoms Pt would like to reschedule his missed monday appointment (due to weather), for tomorrow.   Currently in Pain? No/denies     -3x12 T's against gravity with progression to 6# weights with tactile cues for improved activation, timing and form on the right scapula.  -3x10 U's in standing    Supine serratus anterior strengthening with ball press 3x15 with MIN A for correct form  -Standing shoulder abduction with 6# initially, downgraded to 0#, then upgraded to 3# and performed 2x8 with MOD A to  stabilize glenohumeral joint on the RUE    Manual lymph drainage of the right side of neck, from the mastoid to just inferior of the clavicle x 15 minutes.                              PT Short Term Goals - 04/26/14 1225    PT SHORT TERM GOAL #1   Title Demonstrate independence with HEP. Target 05/24/14   PT SHORT TERM GOAL #2   Title Increase cervical rotation to 35 deg on right and 27 degrees on left. Target 05/24/14   PT SHORT TERM GOAL #3   Title Increase right shoulder abduction to 95 degrees. Target 05/24/14   PT SHORT TERM GOAL #4   Title Demonstrate adequate trunk rotation with ambulation. Target 05/24/14           PT Long Term Goals - 04/26/14 1227    PT LONG TERM GOAL #1   Title Report ability to perform light bench pressing without valsalva (pt's reported goal for therapy). 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 Target 05/24/14   PT LONG TERM GOAL #3   Title Increase R cervical rotation  to 45 degrees and left cervical rotation to 37 degrees for improved safety due to increaesd ability to scan environment while driving or bike riding. Target 05/24/14   PT LONG TERM GOAL #4   Title Demonstrate independence with scar massage. Target 05/24/14               Plan - 05/15/14 1219    Clinical Impression Statement Pt demonstrated significant reduction in swelling. Continue per plan of care.   Rehab Potential Good   PT Frequency 2x / week   PT Duration 8 weeks   PT Treatment/Interventions ADLs/Self Care Home Management;Therapeutic activities;Patient/family education;Scar mobilization;Therapeutic exercise;Gait training;Manual techniques;Manual lymph drainage;Neuromuscular re-education   PT Next Visit Plan continue manual lymph drainage, strengthen shoulder stabilizers and neck   Consulted and Agree with Plan of Care Patient         Problem List Patient Active Problem List   Diagnosis Date Noted  . Genetic testing 03/25/2014  . Thyroid cancer 02/07/2014    Delrae Sawyers D 05/15/2014, 12:25 PM  [facility] [address] [place], [place], [address] Phone: [phone]   Fax:  [phone]

## 2014-05-16 ENCOUNTER — Ambulatory Visit: Payer: BLUE CROSS/BLUE SHIELD

## 2014-05-16 DIAGNOSIS — M7581 Other shoulder lesions, right shoulder: Secondary | ICD-10-CM | POA: Diagnosis not present

## 2014-05-16 DIAGNOSIS — R293 Abnormal posture: Secondary | ICD-10-CM

## 2014-05-16 DIAGNOSIS — R29898 Other symptoms and signs involving the musculoskeletal system: Secondary | ICD-10-CM

## 2014-05-16 DIAGNOSIS — M25611 Stiffness of right shoulder, not elsewhere classified: Secondary | ICD-10-CM

## 2014-05-16 NOTE — Patient Instructions (Signed)
Elevation: Shrug (Distal Resist)   Hold 10# weight in both hands. Lift shoulders straight up, then return. Maintain same speed up and down. Avoid moving head and neck forward. Keep shoulders even and chest square. Repeat 10 times per set. Do _3 ___ sets per session. Do 3 sessions per week. Copyright  VHI. All rights reserved.    Scapular Retraction: Rowing (Eccentric) - Arms - 45 Degrees (Resistance Band)   Hold end of bands (keep black bands together so you have double resistance) in each hand. Pull back until elbows are even with trunk and squeeze shoulder blades. Keep thumbs up. Slowly release for 3-5 seconds. Use two black therabands for resistance. 10 reps per set, 3 sets per day, 3 days per week.   Copyright  VHI. All rights reserved.

## 2014-05-16 NOTE — Therapy (Signed)
[facility] [address] [place], [place], [address] Phone: [phone]   Fax:  [phone]  Physical Therapy Treatment  Patient Details  Name: Randall Walsh MRN: [id number] Date of Birth: 1986-03-18 Referring Provider:  [name], MD  Encounter Date: 05/16/2014      PT End of Session - 05/16/14 1716    Visit Number 7   Number of Visits 17   Date for PT Re-Evaluation 06/25/14   Authorization Type BCBS 20 visit limit   Authorization - Visit Number 7   Authorization - Number of Visits 20   PT Start Time 1751   PT Stop Time 1447   PT Time Calculation (min) 41 min      Past Medical History  Diagnosis Date  . Hypertension   . Anxiety   . Depression   . Headache     migraines  . Cancer     thyroid cancer - papillary 2015    Past Surgical History  Procedure Laterality Date  . Radical neck dissection Right 02/07/2014    Procedure: RIGHT NECK DISSECTION;  Surgeon: [name], MD;  Location: [facility];  Service: ENT;  Laterality: Right;  . Thyroidectomy N/A 02/07/2014    Procedure: TOTAL THYROIDECTOMY;  Surgeon: [name], MD;  Location: [facility];  Service: ENT;  Laterality: N/A;    There were no vitals taken for this visit.  Visit Diagnosis:  Decreased shoulder mobility, right  Weakness of right shoulder  Posture abnormality      Subjective Assessment - 05/16/14 1408    Symptoms Pt feels good after session yesterday.    Currently in Pain? No/denies     All exercises today emphasized shoulder stability, mobility and postural muscle strength   3x30 black theraband resisted rhomboid squeeze rows, then 1x10 with double black theraband  3x10 of the following: Shoulder shrugs with 10# Shoulder internal/external rotation with elbows at side, then 3x10 shoulder intternal/external rotation with elbows out both with 6#  Quadruped rocking 3x10 for shoulder stability Quadruped with RLE and LUE lifts with  therapist providing MIN A to stabilize right shoulder                        PT Short Term Goals - 04/26/14 1225    PT SHORT TERM GOAL #1   Title Demonstrate independence with HEP. Target 05/24/14   PT SHORT TERM GOAL #2   Title Increase cervical rotation to 35 deg on right and 27 degrees on left. Target 05/24/14   PT SHORT TERM GOAL #3   Title Increase right shoulder abduction to 95 degrees. Target 05/24/14   PT SHORT TERM GOAL #4   Title Demonstrate adequate trunk rotation with ambulation. Target 05/24/14           PT Long Term Goals - 04/26/14 1227    PT LONG TERM GOAL #1   Title Report ability to perform light bench pressing without valsalva (pt's reported goal for therapy). 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 Target 05/24/14   PT LONG TERM GOAL #3   Title Increase R cervical rotation to 45 degrees and left cervical rotation to 37 degrees for improved safety due to increaesd ability to scan environment while driving or bike riding. Target 05/24/14   PT LONG TERM GOAL #4   Title Demonstrate  independence with scar massage. Target 05/24/14               Plan - 05/16/14 1717    Clinical Impression Statement Pt is making progress with R shoulder stability and notable reduction in swelling   PT Next Visit Plan begin checking STGs; add pectoralis stretch to HEP and continue with scapular stabilization and neck strength        Problem List Patient Active Problem List   Diagnosis Date Noted  . Genetic testing 03/25/2014  . Thyroid cancer 02/07/2014    Delrae Sawyers D 05/16/2014, 5:19 PM  [facility] [address] [place], [place], [address] Phone: [phone]   Fax:  [phone]

## 2014-05-20 ENCOUNTER — Ambulatory Visit: Payer: BLUE CROSS/BLUE SHIELD | Admitting: Physical Therapy

## 2014-05-20 ENCOUNTER — Encounter: Payer: Self-pay | Admitting: Physical Therapy

## 2014-05-20 DIAGNOSIS — R29898 Other symptoms and signs involving the musculoskeletal system: Secondary | ICD-10-CM

## 2014-05-20 DIAGNOSIS — R293 Abnormal posture: Secondary | ICD-10-CM

## 2014-05-20 DIAGNOSIS — M7581 Other shoulder lesions, right shoulder: Secondary | ICD-10-CM | POA: Diagnosis not present

## 2014-05-20 NOTE — Therapy (Signed)
[facility] [address] [place], [place], [address] Phone: [phone]   Fax:  [phone]  Physical Therapy Treatment  Patient Details  Name: Randall Walsh MRN: [id number] Date of Birth: Sep 25, 1985 Referring Provider:  [name], MD  Encounter Date: 05/20/2014      PT End of Session - 05/20/14 1012    Visit Number 8   Number of Visits 17   Date for PT Re-Evaluation 06/25/14   Authorization Type BCBS 20 visit limit   Authorization - Visit Number 8   Authorization - Number of Visits 20   PT Start Time [phone]   PT Stop Time 1015   PT Time Calculation (min) 39 min      Past Medical History  Diagnosis Date  . Hypertension   . Anxiety   . Depression   . Headache     migraines  . Cancer     thyroid cancer - papillary 2015    Past Surgical History  Procedure Laterality Date  . Radical neck dissection Right 02/07/2014    Procedure: RIGHT NECK DISSECTION;  Surgeon: [name], MD;  Location: [facility];  Service: ENT;  Laterality: Right;  . Thyroidectomy N/A 02/07/2014    Procedure: TOTAL THYROIDECTOMY;  Surgeon: [name], MD;  Location: [facility];  Service: ENT;  Laterality: N/A;    There were no vitals taken for this visit.  Visit Diagnosis:  Weakness of right shoulder  Posture abnormality  Decreased ROM of neck      Subjective Assessment - 05/20/14 0948    Symptoms No new compliants. No pain today.   Currently in Pain? No/denies      Treatment: Exercise: all performed for shoulder stability, mobility and strengthening   UBE level 2.0 x 3 minutes each forward/backward  red pball at wall:  - roll up with extension stretch x 10 reps - circles x 10 each way with emphasis on scapular stability - pushups x 10 with cues on form  Ball to wall and back of pt.- cues with all ex's on form and technique - alternating UE raises 6# weight x 10 each side - "V" 6# weight's x 10 reps - "W" 6# weight x 10  reps - angel wings 6# x 10 reps  Fast ball bounce on wall (small red bowling ball) x 10 at chest, 10 at head and 10 overhead = 1 rep, performed 5 reps  Quadruped rocking 3 x 10 with emphasis on scapular/shoulder stability  Prone with head of corner of YJE:HUDJ bil 5# weights - superman x 10 reps - birdman x 10 reps - aquaman x 10 reps  With black theraband - ER pulls with elbows at sides (simultaneously) x 10 reps, emphasis on control with return - alternating diagonal pulls x 10 reps with emphasis on controlled movements  Added theraband ex's to pt's HEP      PT Short Term Goals - 04/26/14 1225    PT SHORT TERM GOAL #1   Title Demonstrate independence with HEP. Target 05/24/14   PT SHORT TERM GOAL #2   Title Increase cervical rotation to 35 deg on right and 27 degrees on left. Target 05/24/14   PT SHORT TERM GOAL #3   Title Increase right shoulder abduction to 95 degrees. Target 05/24/14   PT SHORT TERM GOAL #4   Title Demonstrate adequate trunk rotation with ambulation. Target 05/24/14           PT Long Term Goals - 04/26/14 1227  PT LONG TERM GOAL #1   Title Report ability to perform light bench pressing without valsalva (pt's reported goal for therapy). 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 Target 05/24/14   PT LONG TERM GOAL #3   Title Increase R cervical rotation to 45 degrees and left cervical rotation to 37 degrees for improved safety due to increaesd ability to scan environment while driving or bike riding. Target 05/24/14   PT LONG TERM GOAL #4   Title Demonstrate independence with scar massage. Target 05/24/14          Plan - 05/20/14 1013    Clinical Impression Statement Pt making steady progress with shoulder stability and strengthening. Swelling continues to remain decreased with pt performing self manual lymph drainage at home.  Progressing toward goals.   Rehab Potential Good   PT Frequency 2x / week   PT Duration 8 weeks   PT Treatment/Interventions ADLs/Self Care Home Management;Therapeutic activities;Patient/family education;Scar mobilization;Therapeutic exercise;Gait training;Manual techniques;Manual lymph drainage;Neuromuscular re-education   PT Next Visit Plan continue manual lymph drainage, strengthen shoulder stabilizers and neck   Consulted and Agree with Plan of Care Patient      Problem List Patient Active Problem List   Diagnosis Date Noted  . Genetic testing 03/25/2014  . Thyroid cancer 02/07/2014    [name] 05/20/2014, 3:26 PM  [name], PTA, [facility] [address], [place], [place] [address] [phone] 05/20/2014, 3:27 PM

## 2014-05-22 ENCOUNTER — Encounter: Payer: Self-pay | Admitting: Physical Therapy

## 2014-05-22 ENCOUNTER — Ambulatory Visit: Payer: BLUE CROSS/BLUE SHIELD | Admitting: Physical Therapy

## 2014-05-22 DIAGNOSIS — M25611 Stiffness of right shoulder, not elsewhere classified: Secondary | ICD-10-CM

## 2014-05-22 DIAGNOSIS — R29898 Other symptoms and signs involving the musculoskeletal system: Secondary | ICD-10-CM

## 2014-05-22 DIAGNOSIS — M7581 Other shoulder lesions, right shoulder: Secondary | ICD-10-CM | POA: Diagnosis not present

## 2014-05-22 NOTE — Therapy (Signed)
[facility] [address] [place], [place], [address] Phone: [phone]   Fax:  [phone]  Physical Therapy Treatment  Patient Details  Name: Randall Walsh MRN: [id number] Date of Birth: 1985/08/25 Referring Provider:  [name], MD  Encounter Date: 05/22/2014      PT End of Session - 05/22/14 0938    Visit Number 9   Number of Visits 17   Date for PT Re-Evaluation 06/25/14   Authorization Type BCBS 20 visit limit   Authorization - Visit Number 9   Authorization - Number of Visits 20   PT Start Time 0933   PT Stop Time 1013   PT Time Calculation (min) 40 min   Equipment Utilized During Treatment Gait belt   Activity Tolerance Patient tolerated treatment well   Behavior During Therapy [facility] for tasks assessed/performed      Past Medical History  Diagnosis Date  . Hypertension   . Anxiety   . Depression   . Headache     migraines  . Cancer     thyroid cancer - papillary 2015    Past Surgical History  Procedure Laterality Date  . Radical neck dissection Right 02/07/2014    Procedure: RIGHT NECK DISSECTION;  Surgeon: [name], MD;  Location: [facility];  Service: ENT;  Laterality: Right;  . Thyroidectomy N/A 02/07/2014    Procedure: TOTAL THYROIDECTOMY;  Surgeon: [name], MD;  Location: [facility];  Service: ENT;  Laterality: N/A;    There were no vitals taken for this visit.  Visit Diagnosis:  Weakness of right shoulder  Decreased shoulder mobility, right      Subjective Assessment - 05/22/14 0937    Symptoms Sore after last session, better today. No pain.      Treatment : UBE level 2.0 x 3 minutes each forward/backards with cues on tall posture  Red pball at wall: - roll up wall with extension stretch 5 sec hold x 10 reps - circles both ways x 15 each with emphasis on scapular stabilization - pushups x 15 with cues on shoulders down/scapular stability  Ball to wall and back of pt- 6#  weights used, cues on form and technique - alternating UE raises x 12 each side - "V" and "W" x 12 each - angle wings x 12 reps  Fast ball bounce on wall at chest, head, overhead levels. 15 times each x 5 reps each spot  Quadruped rocking x 10 reps with emphasis on scapular/shoulder stability  Prone with head at/off corner of mat: with 5# hand weights - superman x 10 reps - birdman x 10 reps - aquaman x 10 reps  Upper body to mid chest/waist off mat with hand on red pball on floor and arms extended: - pushups x 10 with emphasis on maintaining posture and shoulder stability - roll out and in x 10 with same emphasis as above - alternating UE raises x 10 off ball with emphasis as above        PT Short Term Goals - 04/26/14 1225    PT SHORT TERM GOAL #1   Title Demonstrate independence with HEP. Target 05/24/14   PT SHORT TERM GOAL #2   Title Increase cervical rotation to 35 deg on right and 27 degrees on left. Target 05/24/14   PT SHORT TERM GOAL #3   Title Increase right shoulder abduction to 95 degrees. Target 05/24/14   PT SHORT TERM GOAL #4   Title Demonstrate adequate trunk rotation with  ambulation. Target 05/24/14           PT Long Term Goals - 04/26/14 1227    PT LONG TERM GOAL #1   Title Report ability to perform light bench pressing without valsalva (pt's reported goal for therapy). 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 Target 05/24/14   PT LONG TERM GOAL #3   Title Increase R cervical rotation to 45 degrees and left cervical rotation to 37 degrees for improved safety due to increaesd ability to scan environment while driving or bike riding. Target 05/24/14   PT LONG TERM GOAL #4   Title Demonstrate independence with scar massage. Target 05/24/14           Plan - 05/22/14 0938    Clinical Impression Statement Pt continues to make great  progress toward goals.   Rehab Potential Good   PT Frequency 2x / week   PT Duration 8 weeks   PT Treatment/Interventions ADLs/Self Care Home Management;Therapeutic activities;Patient/family education;Scar mobilization;Therapeutic exercise;Gait training;Manual techniques;Manual lymph drainage;Neuromuscular re-education   PT Next Visit Plan continue manual lymph drainage, strengthen shoulder stabilizers and neck   Consulted and Agree with Plan of Care Patient      Problem List Patient Active Problem List   Diagnosis Date Noted  . Genetic testing 03/25/2014  . Thyroid cancer 02/07/2014    [name] 05/22/2014, 12:59 PM  [name], PTA, [facility] [address], [place], [place] [address] [phone] 05/22/2014, 12:59 PM

## 2014-05-30 ENCOUNTER — Encounter: Payer: Self-pay | Admitting: Physical Therapy

## 2014-05-30 ENCOUNTER — Ambulatory Visit: Payer: BLUE CROSS/BLUE SHIELD | Attending: Otolaryngology | Admitting: Physical Therapy

## 2014-05-30 DIAGNOSIS — R29898 Other symptoms and signs involving the musculoskeletal system: Secondary | ICD-10-CM | POA: Diagnosis not present

## 2014-05-30 DIAGNOSIS — R293 Abnormal posture: Secondary | ICD-10-CM

## 2014-05-30 DIAGNOSIS — M7581 Other shoulder lesions, right shoulder: Secondary | ICD-10-CM | POA: Diagnosis not present

## 2014-05-30 DIAGNOSIS — M25611 Stiffness of right shoulder, not elsewhere classified: Secondary | ICD-10-CM

## 2014-05-30 NOTE — Therapy (Signed)
[facility] [address] [place], [place], [address] Phone: [phone]   Fax:  [phone]  Physical Therapy Treatment  Patient Details  Name: Randall Walsh MRN: [id number] Date of Birth: [birth year] Referring Provider:  [name], MD  Encounter Date: 05/30/2014      PT End of Session - 05/30/14 1023    Visit Number 10   Number of Visits 17   Date for PT Re-Evaluation 06/25/14   Authorization Type BCBS 20 visit limit   Authorization - Visit Number 10   Authorization - Number of Visits 20   PT Start Time 3893   PT Stop Time 1100   PT Time Calculation (min) 42 min   Equipment Utilized During Treatment Gait belt   Activity Tolerance Patient tolerated treatment well   Behavior During Therapy WFL for tasks assessed/performed      Past Medical History  Diagnosis Date  . Hypertension   . Anxiety   . Depression   . Headache     migraines  . Cancer     thyroid cancer - papillary 2015    Past Surgical History  Procedure Laterality Date  . Radical neck dissection Right 02/07/2014    Procedure: RIGHT NECK DISSECTION;  Surgeon: [name], MD;  Location: [facility];  Service: ENT;  Laterality: Right;  . Thyroidectomy N/A 02/07/2014    Procedure: TOTAL THYROIDECTOMY;  Surgeon: [name], MD;  Location: [facility];  Service: ENT;  Laterality: N/A;    There were no vitals taken for this visit.  Visit Diagnosis:  Weakness of right shoulder  Decreased shoulder mobility, right  Posture abnormality      Subjective Assessment - 05/30/14 1023    Symptoms No new complaints. Sore after exercises, "a good sore". No pain.   Currently in Pain? No/denies     Treatment UBE level 3.0 x 3 minutes both forward and backward directions.  Red p-ball at wall -roll up/down wall with flexion stretch at top x 15 reps - circles both ways x 15 each way with emphasis on scapular stabilization - pushups x 15 with, less cues needed  for shoulder depression, cues on scapular stability  Ball to wall and back of patient with 6# weights, cues on posture and cervical retraction with ex's - alternating UE raises x 15 each side - "V" and "W" x 15 each - angel wings x 15 reps  Fast ball bounce on wall at chest, head and overhead levels (with red bowling ball): 30 sec's each level x 2 reps at each level  Quadruped rocking forward/backward with emphasis on scapular stability through out motion x 10 reps  Prone with head off corner of mat with 5# weights - superman x 10 reps - birdman x 10 reps - aquaman x 10 reps  With upper body to chest/waist off edge of elevated mat, hands on red pball on floor with arms fully extended: - pushups x 10 reps with cues on stability and posture - roll out and back with emphasis on scapular stability x 10 reps - alternating UE raises x 10 each side with emphasis on scapular stability         PT Short Term Goals - 04/26/14 1225    PT SHORT TERM GOAL #1   Title Demonstrate independence with HEP. Target 05/24/14   PT SHORT TERM GOAL #2   Title Increase cervical rotation to 35 deg on right and 27 degrees on left. Target 05/24/14   PT SHORT  TERM GOAL #3   Title Increase right shoulder abduction to 95 degrees. Target 05/24/14   PT SHORT TERM GOAL #4   Title Demonstrate adequate trunk rotation with ambulation. Target 05/24/14           PT Long Term Goals - 04/26/14 1227    PT LONG TERM GOAL #1   Title Report ability to perform light bench pressing without valsalva (pt's reported goal for therapy). 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 Target 05/24/14   PT LONG TERM GOAL #3   Title Increase R cervical rotation to 45 degrees and left cervical rotation to 37 degrees for improved safety due to increaesd ability to scan environment while driving or bike riding. Target  05/24/14   PT LONG TERM GOAL #4   Title Demonstrate independence with scar massage. Target 05/24/14           Plan - 05/30/14 1023    Clinical Impression Statement Progressing well towards goals. Demo's increased ROM with ex's and increased strength with increased reps.   Rehab Potential Good   PT Frequency 2x / week   PT Duration 8 weeks   PT Treatment/Interventions ADLs/Self Care Home Management;Therapeutic activities;Patient/family education;Scar mobilization;Therapeutic exercise;Gait training;Manual techniques;Manual lymph drainage;Neuromuscular re-education   PT Next Visit Plan continue manual lymph drainage, strengthen shoulder stabilizers and neck   Consulted and Agree with Plan of Care Patient     Problem List Patient Active Problem List   Diagnosis Date Noted  . Genetic testing 03/25/2014  . Thyroid cancer 02/07/2014    [name] 05/30/2014, 2:35 PM  [name], PTA, [facility] [address] [place], [place] [address] [phone] 05/30/2014, 2:35 PM

## 2014-05-31 ENCOUNTER — Ambulatory Visit: Payer: BLUE CROSS/BLUE SHIELD | Admitting: Physical Therapy

## 2014-05-31 ENCOUNTER — Encounter: Payer: Self-pay | Admitting: Physical Therapy

## 2014-05-31 DIAGNOSIS — M25611 Stiffness of right shoulder, not elsewhere classified: Secondary | ICD-10-CM

## 2014-05-31 DIAGNOSIS — R29898 Other symptoms and signs involving the musculoskeletal system: Secondary | ICD-10-CM

## 2014-05-31 DIAGNOSIS — M7581 Other shoulder lesions, right shoulder: Secondary | ICD-10-CM | POA: Diagnosis not present

## 2014-05-31 NOTE — Therapy (Signed)
[facility] [address] [place], [place], [address] Phone: [phone]   Fax:  [phone]  Physical Therapy Treatment  Patient Details  Name: Randall Walsh MRN: [id number] Date of Birth: 20-Oct-1985 Referring Provider:  [name], MD  Encounter Date: 05/31/2014      PT End of Session - 05/31/14 0900    Visit Number 11   Number of Visits 17   Date for PT Re-Evaluation 06/25/14   Authorization Type BCBS 20 visit limit   Authorization - Visit Number 11   Authorization - Number of Visits 20   PT Start Time 1497   PT Stop Time 0926   PT Time Calculation (min) 39 min   Equipment Utilized During Treatment Gait belt   Activity Tolerance Patient tolerated treatment well   Behavior During Therapy [facility] for tasks assessed/performed      Past Medical History  Diagnosis Date  . Hypertension   . Anxiety   . Depression   . Headache     migraines  . Cancer     thyroid cancer - papillary 2015    Past Surgical History  Procedure Laterality Date  . Radical neck dissection Right 02/07/2014    Procedure: RIGHT NECK DISSECTION;  Surgeon: [name], MD;  Location: [facility];  Service: ENT;  Laterality: Right;  . Thyroidectomy N/A 02/07/2014    Procedure: TOTAL THYROIDECTOMY;  Surgeon: [name], MD;  Location: [facility];  Service: ENT;  Laterality: N/A;    There were no vitals taken for this visit.  Visit Diagnosis:  Weakness of right shoulder  Decreased shoulder mobility, right      Subjective Assessment - 05/31/14 0852    Symptoms No new complaints. Reports feeling good afer yesterday's session. No   Currently in Pain? No/denies     Treatment  UBE level 3.0 x 3 minutes each forward/backward directions   With upper body to chest/waist off edge of elevated mat, hands on red pball on floor with arms fully extended: - pushups x 10 reps with cues on stability and posture - roll out and back with emphasis on scapular  stability x 10 reps - alternating UE raises x 10 each side with emphasis on scapular stability  Inverted BOSU with pillowcase over it: - modified pushups x 10 reps with emphasis on scapular stability - plank hold x 10 sec x 5 reps with cues on posture/form and scapular/shoulder stability  With blue theraband - anchored under foot: diagonal pull across body with arm opposite the side anchored x 10 each side - door anchored: assisted flexion/resisted extension x 10 each side;single arm  "chops" x 10 each side, cues on technique and to decrease trunk movements so to focus on scapular mobility  Prone with head off corner of mat with 5# weights - superman x 10 reps - birdman x 10 reps - aquaman x 10 reps  Fast ball bounce on wall at chest, head and overhead level's (with red bowling ball): 30 sec's each level x 3 reps each level. Cues on scapular stability as fatigue set in.        PT Short Term Goals - 05/31/14 1221    PT SHORT TERM GOAL #1   Title Demonstrate independence with HEP. Target 05/24/14   Status Achieved   PT SHORT TERM GOAL #2   Title Increase cervical rotation to 35 deg on right and 27 degrees on left. Target 05/24/14   Status Achieved   PT SHORT TERM GOAL #  3   Title Increase right shoulder abduction to 95 degrees. Target 05/24/14   Status Achieved   PT SHORT TERM GOAL #4   Title Demonstrate adequate trunk rotation with ambulation. Target 05/24/14   Status Achieved           PT Long Term Goals - 04/26/14 1227    PT LONG TERM GOAL #1   Title Report ability to perform light bench pressing without valsalva (pt's reported goal for therapy). 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 Target 05/24/14   PT LONG TERM GOAL #3   Title Increase R cervical rotation to 45 degrees and left cervical rotation to 37 degrees for improved safety due to increaesd  ability to scan environment while driving or bike riding. Target 05/24/14   PT LONG TERM GOAL #4   Title Demonstrate independence with scar massage. Target 05/24/14           Plan - 05/31/14 0900    Clinical Impression Statement Pt making steady progress toward goals. Continues to be challenged by scapular stability ex's and strengtheing ex's. Reported no issues with new ex's performed today.   Rehab Potential Good   PT Frequency 2x / week   PT Duration 8 weeks   PT Treatment/Interventions ADLs/Self Care Home Management;Therapeutic activities;Patient/family education;Scar mobilization;Therapeutic exercise;Gait training;Manual techniques;Manual lymph drainage;Neuromuscular re-education   PT Next Visit Plan continue manual lymph drainage as needed; strengthen shoulder stabilizers and neck   Consulted and Agree with Plan of Care Patient      Problem List Patient Active Problem List   Diagnosis Date Noted  . Genetic testing 03/25/2014  . Thyroid cancer 02/07/2014    [name] 05/31/2014, 12:22 PM  [name], PTA, [facility] [address] [place], [place] [address] [phone] 05/31/2014, 12:22 PM

## 2014-06-06 ENCOUNTER — Encounter: Payer: Self-pay | Admitting: Physical Therapy

## 2014-06-06 ENCOUNTER — Ambulatory Visit: Payer: BLUE CROSS/BLUE SHIELD | Admitting: Physical Therapy

## 2014-06-06 DIAGNOSIS — R29898 Other symptoms and signs involving the musculoskeletal system: Secondary | ICD-10-CM

## 2014-06-06 DIAGNOSIS — R293 Abnormal posture: Secondary | ICD-10-CM

## 2014-06-06 DIAGNOSIS — M25611 Stiffness of right shoulder, not elsewhere classified: Secondary | ICD-10-CM

## 2014-06-06 DIAGNOSIS — M7581 Other shoulder lesions, right shoulder: Secondary | ICD-10-CM | POA: Diagnosis not present

## 2014-06-06 NOTE — Therapy (Signed)
[facility] [address] [place], [place], [address] Phone: [phone]   Fax:  [phone]  Physical Therapy Treatment  Patient Details  Name: Randall Walsh MRN: [id number] Date of Birth: [birth year] Referring Provider:  [name], MD  Encounter Date: 06/06/2014      PT End of Session - 06/06/14 1450    Visit Number 12   Number of Visits 17   Date for PT Re-Evaluation 06/25/14   Authorization Type BCBS 20 visit limit   Authorization - Visit Number 12   Authorization - Number of Visits 20   PT Start Time 2979   PT Stop Time 1528   PT Time Calculation (min) 40 min   Equipment Utilized During Treatment Gait belt   Activity Tolerance Patient tolerated treatment well   Behavior During Therapy [facility] for tasks assessed/performed      Past Medical History  Diagnosis Date  . Hypertension   . Anxiety   . Depression   . Headache     migraines  . Cancer     thyroid cancer - papillary 2015    Past Surgical History  Procedure Laterality Date  . Radical neck dissection Right 02/07/2014    Procedure: RIGHT NECK DISSECTION;  Surgeon: [name], MD;  Location: [facility];  Service: ENT;  Laterality: Right;  . Thyroidectomy N/A 02/07/2014    Procedure: TOTAL THYROIDECTOMY;  Surgeon: [name], MD;  Location: [facility];  Service: ENT;  Laterality: N/A;    There were no vitals filed for this visit.  Visit Diagnosis:  Decreased shoulder mobility, right  Weakness of right shoulder  Posture abnormality      Subjective Assessment - 06/06/14 1449    Symptoms No new complaints. No pain currently. Feels the therapy is helping.   Currently in Pain? No/denies     Treatment:  UBE level 3.5 x 3 minutes each forward/backwards.  Prone on mat with head off corner: - Superman 5# hand weights x 15 reps - birdman 5# hand weights x 15 reps - Aquaman 5# hand weights x 15 reps  With upper body to chest/waist off edge of  elevated mat, hands on red pball on floor with arms fully extended: - pushups x 15 reps with cues on stability and posture - roll out and back with emphasis on scapular stability x 15 reps - alternating UE raises x 15 each side with emphasis on scapular stability  Fast ball bounce on wall using small red bowling ball: 3 levels (chest, head, overhead) 30 sec's x 3 reps each spot without rest/pausing with emphasis on scapular stability while working on scapular/shoulder endurance  On forearms on mat: full plank, hold 10 sec's x 5 reps with cues on posture, abdominal bracing, pelvic positioning and scapular stability  Inverted BOSU: - plank position: elbow bends with scapular stability 2 sets of 5 reps - plank position: scapular pushups 2 sets of 5 reps - plank position: scapular stability with alternating legs lifts, 2 sets of 5 reps each side        PT Short Term Goals - 05/31/14 1221    PT SHORT TERM GOAL #1   Title Demonstrate independence with HEP. Target 05/24/14   Status Achieved   PT SHORT TERM GOAL #2   Title Increase cervical rotation to 35 deg on right and 27 degrees on left. Target 05/24/14   Status Achieved   PT SHORT TERM GOAL #3   Title Increase right shoulder abduction to 95 degrees.  Target 05/24/14   Status Achieved   PT SHORT TERM GOAL #4   Title Demonstrate adequate trunk rotation with ambulation. Target 05/24/14   Status Achieved           PT Long Term Goals - 04/26/14 1227    PT LONG TERM GOAL #1   Title Report ability to perform light bench pressing without valsalva (pt's reported goal for therapy). 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 Target 05/24/14   PT LONG TERM GOAL #3   Title Increase R cervical rotation to 45 degrees and left cervical rotation to 37 degrees for improved safety due to increaesd ability to scan environment while  driving or bike riding. Target 05/24/14   PT LONG TERM GOAL #4   Title Demonstrate independence with scar massage. Target 05/24/14           Plan - 06/06/14 1451    Clinical Impression Statement Pt continues to need cues for stability/posture/form and fatigues quickly with advanced exercies. Progressing toward his goals.    Rehab Potential Good   PT Frequency 2x / week   PT Duration 8 weeks   PT Treatment/Interventions ADLs/Self Care Home Management;Therapeutic activities;Patient/family education;Scar mobilization;Therapeutic exercise;Gait training;Manual techniques;Manual lymph drainage;Neuromuscular re-education   PT Next Visit Plan continue manual lymph drainage as needed; strengthen shoulder stabilizers and neck.   Consulted and Agree with Plan of Care Patient        Problem List Patient Active Problem List   Diagnosis Date Noted  . Genetic testing 03/25/2014  . Thyroid cancer 02/07/2014    [name] 06/06/2014, 3:52 PM  [name], PTA, [facility] [address] [place], [place] [address] [phone] 06/06/2014, 3:52 PM

## 2014-06-07 ENCOUNTER — Ambulatory Visit: Payer: BLUE CROSS/BLUE SHIELD

## 2014-06-07 DIAGNOSIS — R293 Abnormal posture: Secondary | ICD-10-CM

## 2014-06-07 DIAGNOSIS — R29898 Other symptoms and signs involving the musculoskeletal system: Secondary | ICD-10-CM

## 2014-06-07 DIAGNOSIS — M7581 Other shoulder lesions, right shoulder: Secondary | ICD-10-CM | POA: Diagnosis not present

## 2014-06-07 NOTE — Therapy (Signed)
[facility] [address] [place], [place], [address] Phone: [phone]   Fax:  [phone]  Physical Therapy Treatment  Patient Details  Name: Randall Walsh MRN: [id number] Date of Birth: Sep 06, 1985 Referring Provider:  [name], MD  Encounter Date: 06/07/2014      PT End of Session - 06/07/14 1445    Visit Number 13   Number of Visits 17   Date for PT Re-Evaluation 06/25/14   Authorization Type BCBS 20 visit limit   Authorization - Visit Number 13   Authorization - Number of Visits 20   PT Start Time 5956   PT Stop Time 1447   PT Time Calculation (min) 45 min      Past Medical History  Diagnosis Date  . Hypertension   . Anxiety   . Depression   . Headache     migraines  . Cancer     thyroid cancer - papillary 2015    Past Surgical History  Procedure Laterality Date  . Radical neck dissection Right 02/07/2014    Procedure: RIGHT NECK DISSECTION;  Surgeon: Randall Marble, MD;  Location: [facility];  Service: ENT;  Laterality: Right;  . Thyroidectomy N/A 02/07/2014    Procedure: TOTAL THYROIDECTOMY;  Surgeon: Randall Marble, MD;  Location: [facility];  Service: ENT;  Laterality: N/A;    There were no vitals filed for this visit.  Visit Diagnosis:  Weakness of right shoulder  Posture abnormality      Subjective Assessment - 06/07/14 1405    Symptoms Randall Walsh has really been helping me.   Currently in Pain? No/denies     Forearm planks with lateral "walking" 2x back and forth across mat  Plank on bosu ball (compliant side) 3x15 seconds  3x7 push ups with BUE on inverted BOSU ball  2x10 with 8# free weights slow, eccentric controlled supine latissimus drop backs and pull downs with a serratus press at 90 degress shoulder flexion against gravity  Fast ball bounce on wall x45 seconds each: face level, overhead, then in a large arc around head.  2x10 each T's, V's, M's with pt prone on corner of mat with 5#  hand weights.Verbal cues to slow down to decrease momentum compensation.                             PT Short Term Goals - 05/31/14 1221    PT SHORT TERM GOAL #1   Title Demonstrate independence with HEP. Target 05/24/14   Status Achieved   PT SHORT TERM GOAL #2   Title Increase cervical rotation to 35 deg on right and 27 degrees on left. Target 05/24/14   Status Achieved   PT SHORT TERM GOAL #3   Title Increase right shoulder abduction to 95 degrees. Target 05/24/14   Status Achieved   PT SHORT TERM GOAL #4   Title Demonstrate adequate trunk rotation with ambulation. Target 05/24/14   Status Achieved           PT Long Term Goals - 04/26/14 1227    PT LONG TERM GOAL #1   Title Report ability to perform light bench pressing without valsalva (pt's reported goal for therapy). 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 Target 05/24/14   PT LONG TERM GOAL #3   Title Increase  R cervical rotation to 45 degrees and left cervical rotation to 37 degrees for improved safety due to increaesd ability to scan environment while driving or bike riding. Target 05/24/14   PT LONG TERM GOAL #4   Title Demonstrate independence with scar massage. Target 05/24/14               Plan - 06/07/14 1445    Clinical Impression Statement Pt is making progress with advanced shoulder strengthening exercises. He continues to have decreased scapular and glenohumeral stability and will continue to benefit from skilled PT services to address this.   PT Next Visit Plan Dynamic shoulder strengthening        Problem List Patient Active Problem List   Diagnosis Date Noted  . Genetic testing 03/25/2014  . Thyroid cancer 02/07/2014   Delrae Sawyers, PT,DPT,NCS 06/07/2014 2:47 PM Phone [phone] FAX [phone]         [facility] [address] [place], [place], [address] Phone: [phone]   Fax:  [phone]

## 2014-06-12 ENCOUNTER — Encounter: Payer: Self-pay | Admitting: Physical Therapy

## 2014-06-12 ENCOUNTER — Ambulatory Visit: Payer: BLUE CROSS/BLUE SHIELD | Admitting: Physical Therapy

## 2014-06-12 DIAGNOSIS — R293 Abnormal posture: Secondary | ICD-10-CM

## 2014-06-12 DIAGNOSIS — M7581 Other shoulder lesions, right shoulder: Secondary | ICD-10-CM | POA: Diagnosis not present

## 2014-06-12 DIAGNOSIS — M25611 Stiffness of right shoulder, not elsewhere classified: Secondary | ICD-10-CM

## 2014-06-12 DIAGNOSIS — R29898 Other symptoms and signs involving the musculoskeletal system: Secondary | ICD-10-CM

## 2014-06-12 NOTE — Therapy (Signed)
[facility] [address] [place], [place], [address] Phone: [phone]   Fax:  [phone]  Physical Therapy Treatment  Patient Details  Name: Randall Walsh MRN: [id number] Date of Birth: 03-01-1986 Referring Provider:  [name], MD  Encounter Date: 06/12/2014      PT End of Session - 06/12/14 1408    Visit Number 14   Number of Visits 17   Date for PT Re-Evaluation 06/25/14   Authorization Type BCBS 20 visit limit   Authorization - Visit Number 13   Authorization - Number of Visits 20   PT Start Time 1740   PT Stop Time 1445   PT Time Calculation (min) 42 min   Activity Tolerance Patient tolerated treatment well   Behavior During Therapy [facility] for tasks assessed/performed      Past Medical History  Diagnosis Date  . Hypertension   . Anxiety   . Depression   . Headache     migraines  . Cancer     thyroid cancer - papillary 2015    Past Surgical History  Procedure Laterality Date  . Radical neck dissection Right 02/07/2014    Procedure: RIGHT NECK DISSECTION;  Surgeon: [name], MD;  Location: [facility];  Service: ENT;  Laterality: Right;  . Thyroidectomy N/A 02/07/2014    Procedure: TOTAL THYROIDECTOMY;  Surgeon: [name], MD;  Location: [facility];  Service: ENT;  Laterality: N/A;    There were no vitals filed for this visit.  Visit Diagnosis:  Weakness of right shoulder  Posture abnormality  Decreased shoulder mobility, right      Subjective Assessment - 06/12/14 1406    Symptoms No new complaints. No pain. Started back to work on Monday, for 2 days a week. Reports was really sore after 1st day back, a little better on the 2cd day.   Currently in Pain? No/denies           Treatment:   UBE level 3.5 x 3 minutes each forward/backwards  Prone on mat with head off corner: - Superman 5# hand weights x 2 sets of 10 - birdman 5# hand weights x 2 sets of 10 - Aquaman 5# hand weights x 2  sets of 10  With upper body to chest/waist off edge of elevated mat, hands on red pball on floor with arms fully extended: - pushups x 15 reps with cues on stability and posture - roll out and back with emphasis on scapular stability x 15 reps - alternating UE raises x 15 each side with emphasis on scapular stability  Plank on mat table: walking hand to one edge of mat and then back to other edges of mat back to middle, rest, repeated x 5 reps. Cues on scapular stability, core bracing and body posture/position with exercise  Modified plank with feet on floor and hand on elevated mat table: push off's with clap x 5 with cues and emphasis on posture, abdominal bracing and scapular mobility/stabilizatoin with push off and landing.  Fast ball bounce on wall using small red bowling ball: 3 levels (chest, head, overhead) 45 sec's x 2 reps each spot without rest/pausing with emphasis on scapular stability while working on scapular/shoulder endurance  Inverted BOSU: - plank position: pushups x 10 reps with cues on scapular stability - plank position: scapular pushups 2 sets of 5 reps - plank position: scapular stability with alternating legs lifts, 2 sets of 5 reps each side  PT Short Term Goals - 05/31/14 1221    PT SHORT TERM GOAL #1   Title Demonstrate independence with HEP. Target 05/24/14   Status Achieved   PT SHORT TERM GOAL #2   Title Increase cervical rotation to 35 deg on right and 27 degrees on left. Target 05/24/14   Status Achieved   PT SHORT TERM GOAL #3   Title Increase right shoulder abduction to 95 degrees. Target 05/24/14   Status Achieved   PT SHORT TERM GOAL #4   Title Demonstrate adequate trunk rotation with ambulation. Target 05/24/14   Status Achieved           PT Long Term Goals - 04/26/14 1227    PT LONG TERM GOAL #1   Title Report ability to perform light bench pressing without valsalva (pt's reported goal for therapy). 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 Target 05/24/14   PT LONG TERM GOAL #3   Title Increase R cervical rotation to 45 degrees and left cervical rotation to 37 degrees for improved safety due to increaesd ability to scan environment while driving or bike riding. Target 05/24/14   PT LONG TERM GOAL #4   Title Demonstrate independence with scar massage. Target 05/24/14           Plan - 06/12/14 1408    Clinical Impression Statement Pt is making steady progress toward goals. Continues to be challenged in weight bearing scapular stability activities and endurance activities.   Rehab Potential Good   PT Frequency 2x / week   PT Duration 8 weeks   PT Treatment/Interventions ADLs/Self Care Home Management;Therapeutic activities;Patient/family education;Scar mobilization;Therapeutic exercise;Gait training;Manual techniques;Manual lymph drainage;Neuromuscular re-education   PT Next Visit Plan continue manual lymph drainage as needed; strengthen shoulder stabilizers and neck.   Consulted and Agree with Plan of Care Patient        Problem List Patient Active Problem List   Diagnosis Date Noted  . Genetic testing 03/25/2014  . Thyroid cancer 02/07/2014    [name] 06/13/2014, 4:26 PM  [name], PTA, [facility] [address] [place], [place] [address] [phone] 06/13/2014, 4:26 PM

## 2014-06-14 ENCOUNTER — Ambulatory Visit: Payer: BLUE CROSS/BLUE SHIELD | Admitting: Physical Therapy

## 2014-06-14 ENCOUNTER — Encounter: Payer: Self-pay | Admitting: Physical Therapy

## 2014-06-14 DIAGNOSIS — R293 Abnormal posture: Secondary | ICD-10-CM

## 2014-06-14 DIAGNOSIS — M25611 Stiffness of right shoulder, not elsewhere classified: Secondary | ICD-10-CM

## 2014-06-14 DIAGNOSIS — M7581 Other shoulder lesions, right shoulder: Secondary | ICD-10-CM | POA: Diagnosis not present

## 2014-06-14 DIAGNOSIS — R29898 Other symptoms and signs involving the musculoskeletal system: Secondary | ICD-10-CM

## 2014-06-14 NOTE — Therapy (Signed)
[facility] [address] [place], [place], [address] Phone: [phone]   Fax:  [phone]  Physical Therapy Treatment  Patient Details  Name: Randall Walsh MRN: [id number] Date of Birth: 1985/10/05 Referring Provider:  [name], MD  Encounter Date: 06/14/2014      PT End of Session - 06/14/14 1409    Visit Number 15   Number of Visits 17   Date for PT Re-Evaluation 06/25/14   Authorization Type [employer] 20 visit limit   Authorization - Visit Number 15   Authorization - Number of Visits 20   PT Start Time 1607   PT Stop Time 1445   PT Time Calculation (min) 42 min   Activity Tolerance Patient tolerated treatment well   Behavior During Therapy [facility] for tasks assessed/performed      Past Medical History  Diagnosis Date  . Hypertension   . Anxiety   . Depression   . Headache     migraines  . Cancer     thyroid cancer - papillary 2015    Past Surgical History  Procedure Laterality Date  . Radical neck dissection Right 02/07/2014    Procedure: RIGHT NECK DISSECTION;  Surgeon: [name], MD;  Location: [place];  Service: ENT;  Laterality: Right;  . Thyroidectomy N/A 02/07/2014    Procedure: TOTAL THYROIDECTOMY;  Surgeon: [name], MD;  Location: [place];  Service: ENT;  Laterality: N/A;    There were no vitals filed for this visit.  Visit Diagnosis:  Weakness of right shoulder  Posture abnormality  Decreased shoulder mobility, right      Subjective Assessment - 06/14/14 1409    Symptoms No new complaints. No changes since last visit.   Currently in Pain? No/denies      Treatment:  UBE level 4.0 x 3 minutes each forward/backwards  Prone on mat with head off corner: - Superman 5# hand weights x 2 sets of 10 - birdman 5# hand weights x 2 sets of 10 - Aquaman 5# hand weights x 2 sets of 10  With upper body to chest/waist off edge of elevated mat, hands on red pball on floor with arms fully  extended: - pushups 2 sets of 10 reps with cues on stability and posture - roll out and back with emphasis on scapular stability 2 sets of 10 reps - alternating UE raises 2 sets of 10 reps each side with emphasis on scapular stability  Plank on mat table: walking hand to one edge of mat and then back to other edges of mat back to middle, set reps for 5 cycles with rest between cycles. Cues on scapular stability, core bracing and body posture/position with exercise (not to rotate trunk).  Modified plank with feet on floor and hand on elevated mat table:1. push off's with clap x 5 with cues and emphasis on posture, abdominal bracing and scapular mobility/stabilizatoin with push off and landing. 2. Side planks with 5 sec hold x 5 each way, alternating sides.  Fast ball bounce on wall using small red bowling ball: 3 levels (chest, head, overhead) with 2# wrist weights bil sides, 30 sec's x 3 reps each spot without rest/pausing with emphasis on scapular stability while working on scapular/shoulder endurance  Inverted BOSU: - plank position: pushups x 10 reps with cues on scapular stability - plank position: scapular stability with alternating legs lifts, 2 sets of 5 reps each side        PT Short Term Goals -  05/31/14 1221    PT SHORT TERM GOAL #1   Title Demonstrate independence with HEP. Target 05/24/14   Status Achieved   PT SHORT TERM GOAL #2   Title Increase cervical rotation to 35 deg on right and 27 degrees on left. Target 05/24/14   Status Achieved   PT SHORT TERM GOAL #3   Title Increase right shoulder abduction to 95 degrees. Target 05/24/14   Status Achieved   PT SHORT TERM GOAL #4   Title Demonstrate adequate trunk rotation with ambulation. Target 05/24/14   Status Achieved           PT Long Term Goals - 04/26/14 1227    PT LONG TERM GOAL #1   Title Report ability to perform light bench pressing without valsalva (pt's reported goal for therapy). 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 Target 05/24/14   PT LONG TERM GOAL #3   Title Increase R cervical rotation to 45 degrees and left cervical rotation to 37 degrees for improved safety due to increaesd ability to scan environment while driving or bike riding. Target 05/24/14   PT LONG TERM GOAL #4   Title Demonstrate independence with scar massage. Target 05/24/14           Plan - 06/14/14 1410    Clinical Impression Statement Pt continues to make steady progress toward goals.    Rehab Potential Good   PT Frequency 2x / week   PT Duration 8 weeks   PT Treatment/Interventions ADLs/Self Care Home Management;Therapeutic activities;Patient/family education;Scar mobilization;Therapeutic exercise;Gait training;Manual techniques;Manual lymph drainage;Neuromuscular re-education   PT Next Visit Plan continue manual lymph drainage as needed; strengthen shoulder stabilizers and neck.   Consulted and Agree with Plan of Care Patient      Problem List Patient Active Problem List   Diagnosis Date Noted  . Genetic testing 03/25/2014  . Thyroid cancer 02/07/2014    Randall Walsh 06/14/2014, 2:52 PM  Randall Walsh, PTA, [facility] [address], [place], [place] [address] [phone] 06/14/2014, 2:52 PM

## 2014-06-18 ENCOUNTER — Ambulatory Visit: Payer: BLUE CROSS/BLUE SHIELD

## 2014-06-18 DIAGNOSIS — M7581 Other shoulder lesions, right shoulder: Secondary | ICD-10-CM | POA: Diagnosis not present

## 2014-06-18 DIAGNOSIS — M25611 Stiffness of right shoulder, not elsewhere classified: Secondary | ICD-10-CM

## 2014-06-18 DIAGNOSIS — R29898 Other symptoms and signs involving the musculoskeletal system: Secondary | ICD-10-CM

## 2014-06-18 NOTE — Therapy (Signed)
[facility] [address] [place], [place], [address] Phone: [phone]   Fax:  [phone]  Physical Therapy Treatment  Patient Details  Name: Randall Walsh MRN: [id number] Date of Birth: 09-15-1985 Referring Provider:  [name], MD  Encounter Date: 06/18/2014      PT End of Session - 06/18/14 1510    Visit Number 16   Number of Visits 17   Date for PT Re-Evaluation 06/25/14   Authorization Type [employer] 20 visit limit   Authorization - Visit Number 16   Authorization - Number of Visits 20   PT Start Time 3143   PT Stop Time 1445   PT Time Calculation (min) 41 min      Past Medical History  Diagnosis Date  . Hypertension   . Anxiety   . Depression   . Headache     migraines  . Cancer     thyroid cancer - papillary 2015    Past Surgical History  Procedure Laterality Date  . Radical neck dissection Right 02/07/2014    Procedure: RIGHT NECK DISSECTION;  Surgeon: [name], MD;  Location: [facility];  Service: ENT;  Laterality: Right;  . Thyroidectomy N/A 02/07/2014    Procedure: TOTAL THYROIDECTOMY;  Surgeon: [name], MD;  Location: [place] [facility];  Service: ENT;  Laterality: N/A;    There were no vitals filed for this visit.  Visit Diagnosis:  Weakness of right shoulder  Decreased ROM of neck  Decreased shoulder mobility, right      Subjective Assessment - 06/18/14 1407    Symptoms Pt feels stronger with push ups and other more intense shoulder exercises that require stability. Discussed option of d/c today to save visits in case there is a future need this year, and pt would like to do this.   Currently in Pain? No/denies     Bench press with dowel and 38# in supine Lateral "stepping" planks 2 trials of 3 laps on mat Pt has full cervical rotation AROM bilateral and full shoulder abduction bilateral Planks with alternate UE lifts--intially with support on R shoulder, then without--3x3 reps each  arm. 3x10 tricep push ups (in "crab" position) 3 repetitions of side planks each side with contralateral UE reaches forward, upward, backward 4x5 push ups with BUE on inverted BOSU ball, verbal cues for increased control of R elbow to prevent snapping  Pt was provided with therapist's card to call if he has any questions.                                PT Short Term Goals - 05/31/14 1221    PT SHORT TERM GOAL #1   Title Demonstrate independence with HEP. Target 05/24/14   Status Achieved   PT SHORT TERM GOAL #2   Title Increase cervical rotation to 35 deg on right and 27 degrees on left. Target 05/24/14   Status Achieved   PT SHORT TERM GOAL #3   Title Increase right shoulder abduction to 95 degrees. Target 05/24/14   Status Achieved   PT SHORT TERM GOAL #4   Title Demonstrate adequate trunk rotation with ambulation. Target 05/24/14   Status Achieved           PT Long Term Goals - 06/18/14 1408    PT LONG TERM GOAL #1   Title Report ability to perform light bench pressing without valsalva (pt's reported goal for therapy). 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 Target 05/24/14   Status Achieved  Now has full AROM 180 degrees   PT LONG TERM GOAL #3   Title Increase R cervical rotation to 45 degrees and left cervical rotation to 37 degrees for improved safety due to increaesd ability to scan environment while driving or bike riding. Target 05/24/14   Status Achieved  has full AROM cervical rotation bilateral   PT LONG TERM GOAL #4   Title Demonstrate independence with scar massage. Target 05/24/14   Status Achieved               Plan - 06/18/14 1511    Clinical Impression Statement Pt no longer demonstrates shoudler instability and has full AROM of shoulder and neck. Ready for d/c  today.   PT Next Visit Plan d.c today   Consulted and Agree with Plan of Care Patient        Problem List Patient Active Problem List   Diagnosis Date Noted  . Genetic testing 03/25/2014  . Thyroid cancer 02/07/2014   Delrae Sawyers, PT,DPT,NCS 06/18/2014 3:13 PM Phone [phone] FAX [phone]         [facility] [address] [place], [place], [address] Phone: [phone]   Fax:  [phone]   PHYSICAL THERAPY DISCHARGE SUMMARY  Visits from Start of Care: 16  Current functional level related to goals / functional outcomes: Pt met all therapy goals. Now has full AROM of neck and shoulders. Shoulder is no longer unstable, just weak.    Remaining deficits: Shoulder weakness, some edema in neck.   Education / Equipment: Scar massage and self-performed lymph drainage massage of neck, shoulder strengthening HEP. Pt independent with these.  Plan: Patient agrees to discharge.  Patient goals were met. Patient is being discharged due to meeting the stated rehab goals.  ?????

## 2014-06-20 ENCOUNTER — Ambulatory Visit: Payer: BLUE CROSS/BLUE SHIELD

## 2015-07-18 ENCOUNTER — Other Ambulatory Visit: Payer: Self-pay | Admitting: Internal Medicine

## 2015-07-18 DIAGNOSIS — C73 Malignant neoplasm of thyroid gland: Secondary | ICD-10-CM

## 2016-01-01 IMAGING — CR DG CHEST 2V
2 series · 2 of 2 positions shown · non-contrast
Comparison: None.

CLINICAL DATA: Thyroid cancer, preop testing.

EXAM:
CHEST  2 VIEW

[w chest pa]
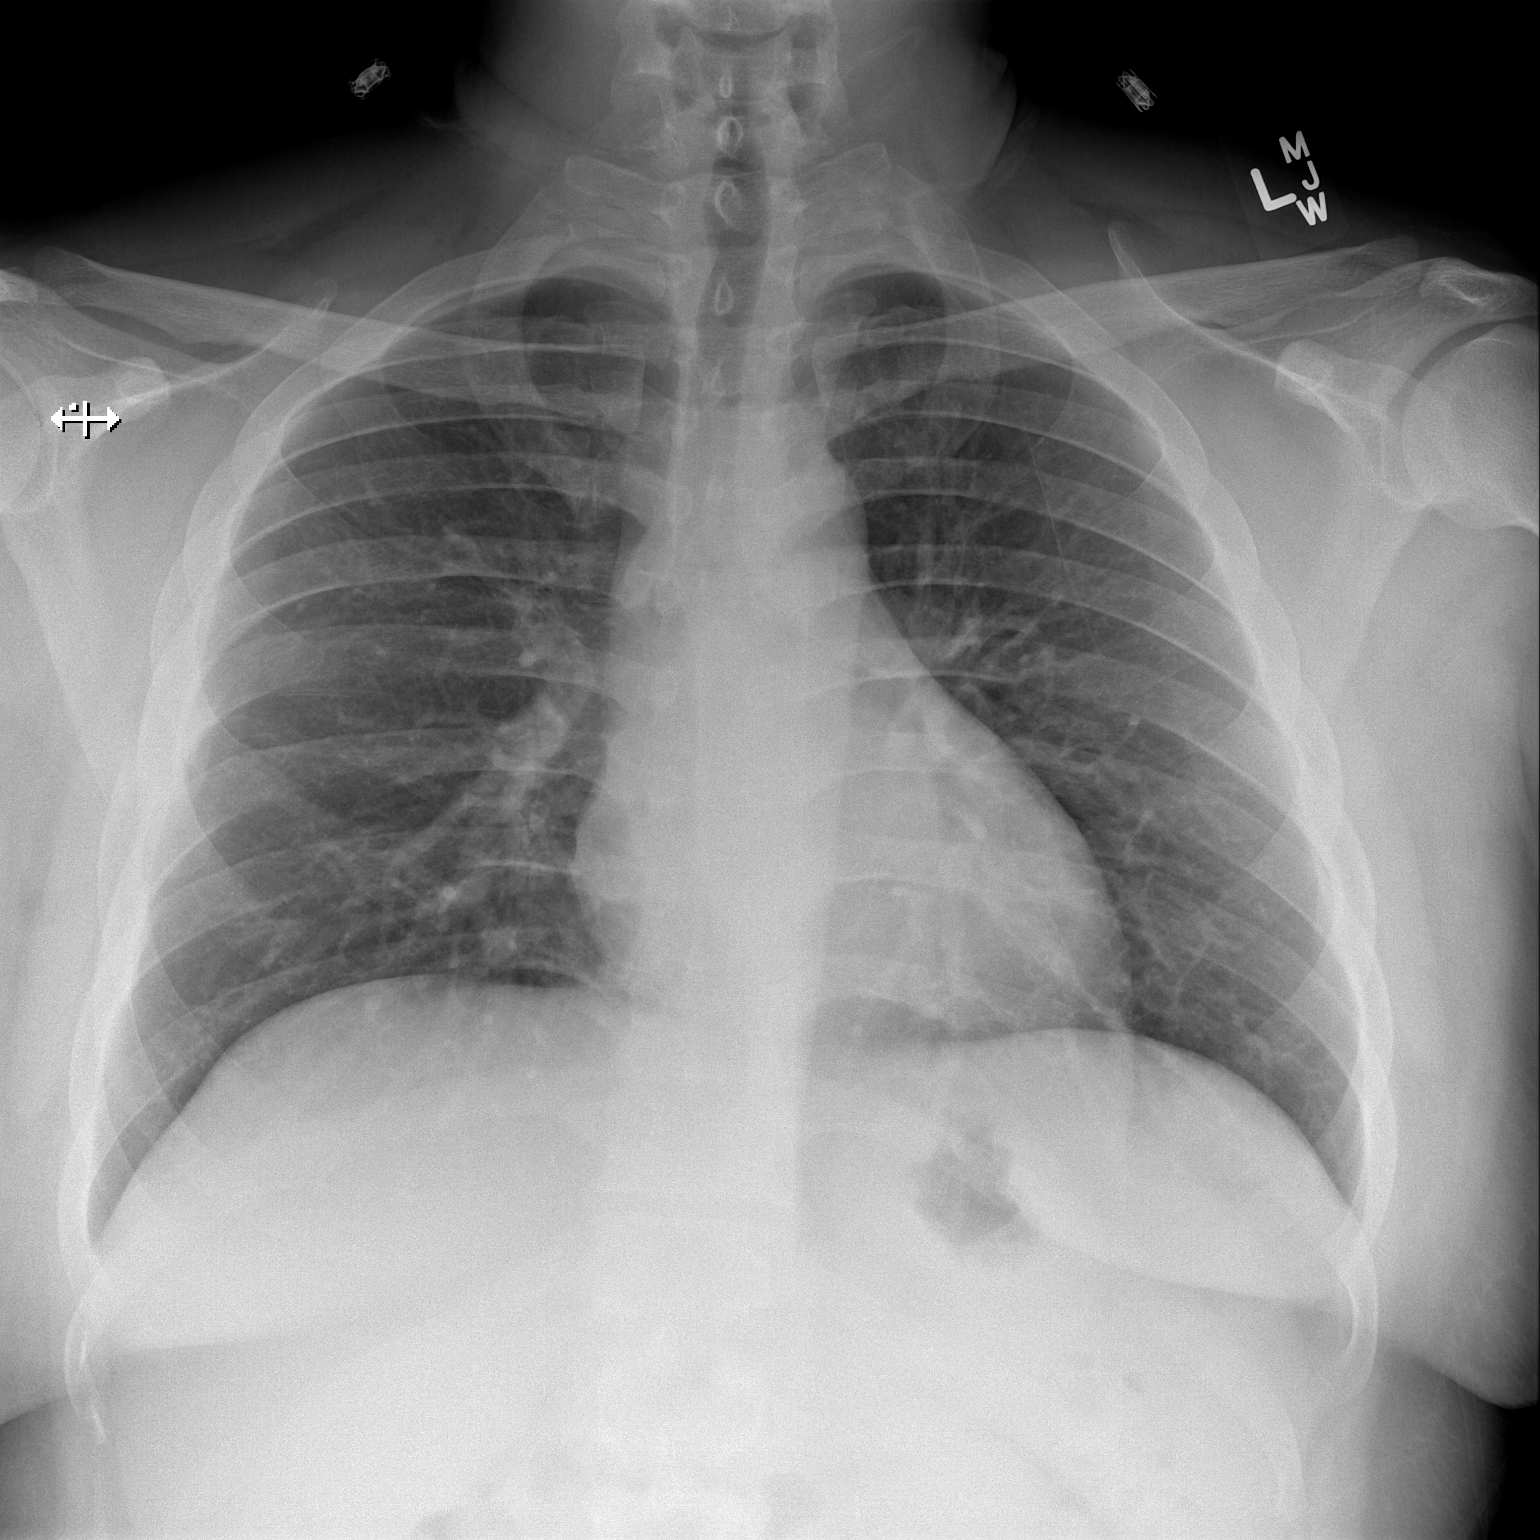

[w chest lat]
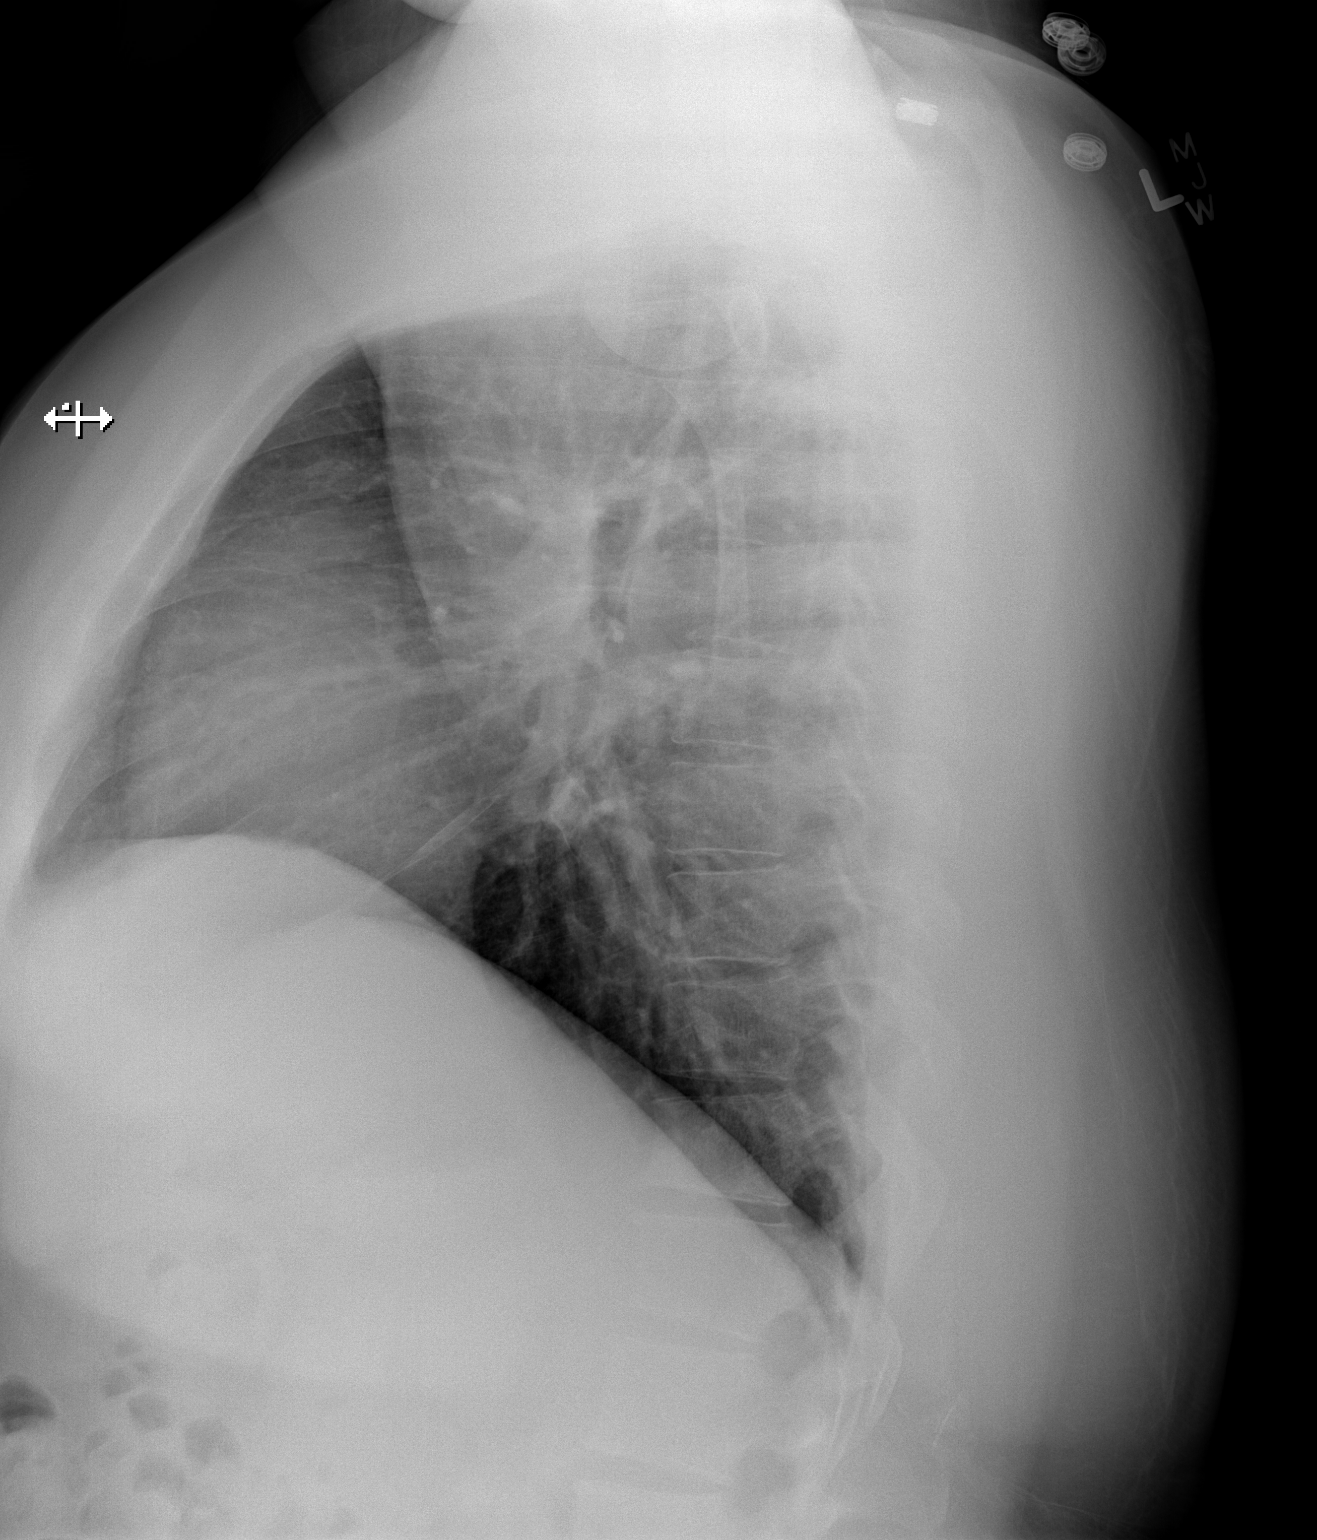

[2 of 2 positions shown; findings below may reference images not displayed]

FINDINGS: Normal heart size and mediastinal contours. No acute infiltrate or
edema. No evidence of pulmonary nodule. No effusion or pneumothorax.
No acute osseous findings.
IMPRESSION: No active cardiopulmonary disease or nodule.

## 2016-01-16 ENCOUNTER — Other Ambulatory Visit: Payer: BLUE CROSS/BLUE SHIELD

## 2016-01-27 IMAGING — US US SOFT TISSUE HEAD/NECK
1 series · 14 of 25 positions shown · non-contrast
Comparison: CT neck of 01/21/2014

CLINICAL DATA: History of total thyroidectomy for papillary thyroid
carcinoma, evaluate for residual thyroid tissue and/or lymph nodes

EXAM:
THYROID ULTRASOUND

[Series 1: us soft tissue head/neck · 0.09mm/px · 14 of 40 slices shown]
[im 1/40]
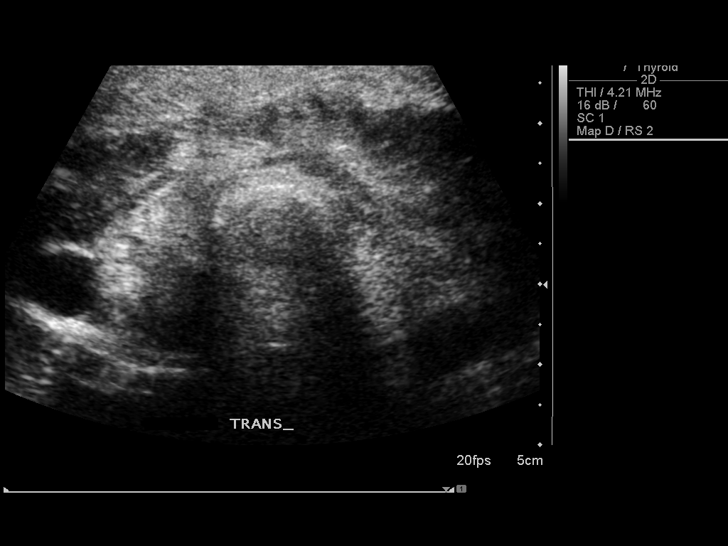
[im 4/40]
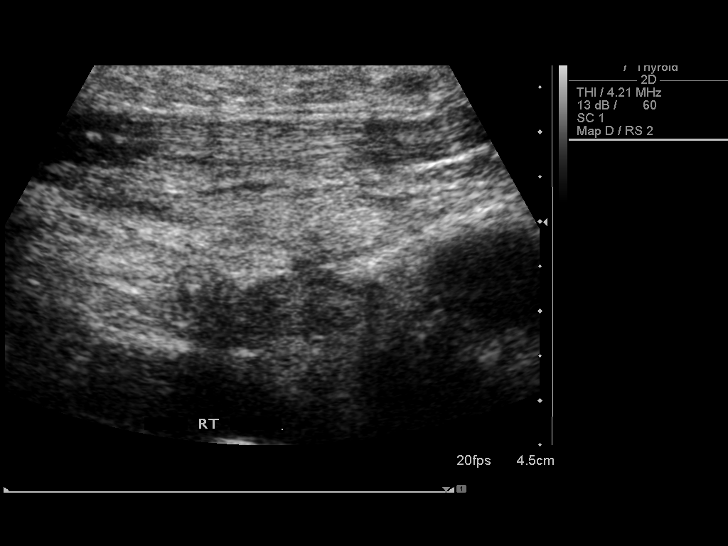
[im 7/40]
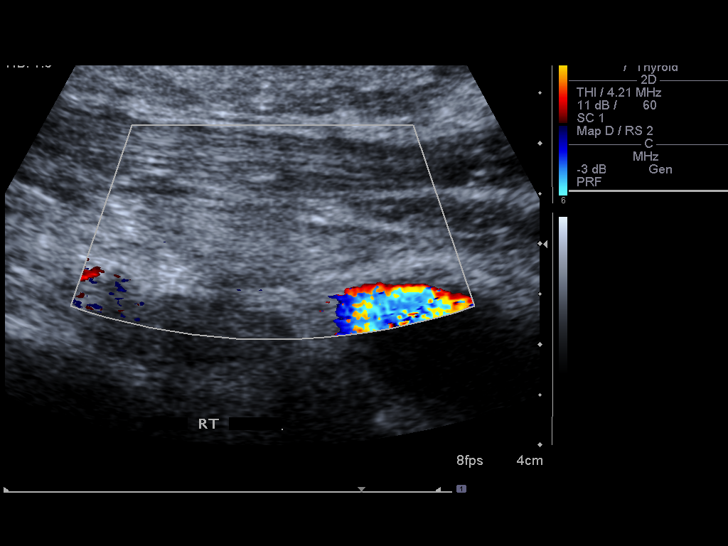
[im 10/40]
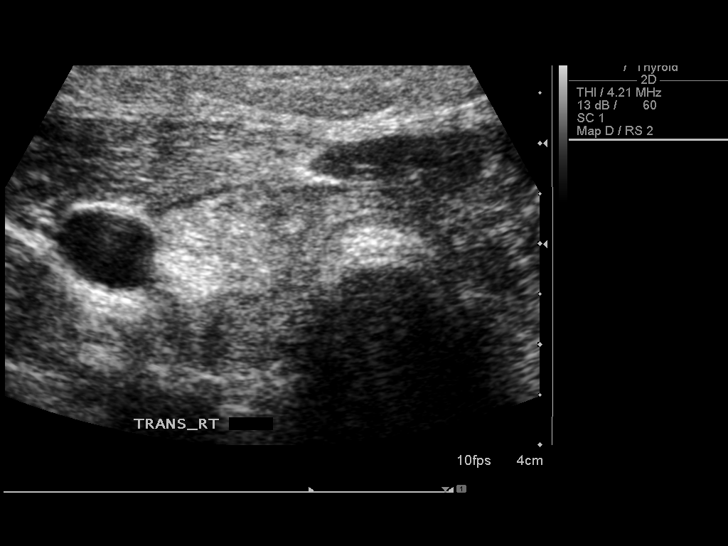
[im 14/40]
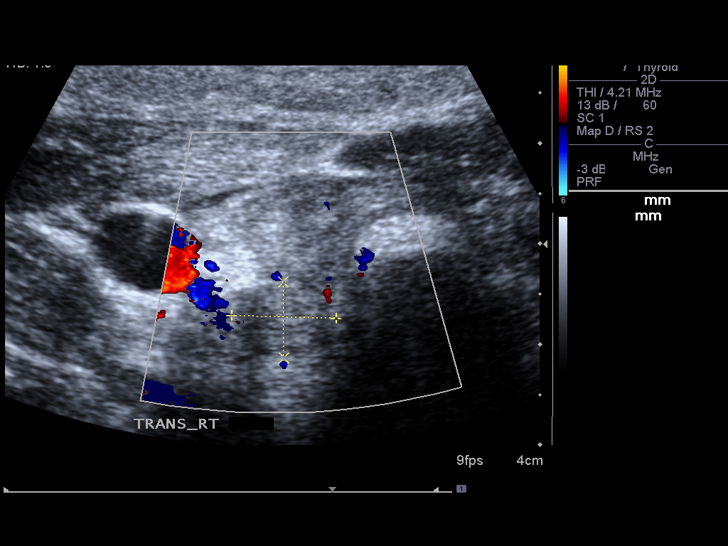
[im 15/40]
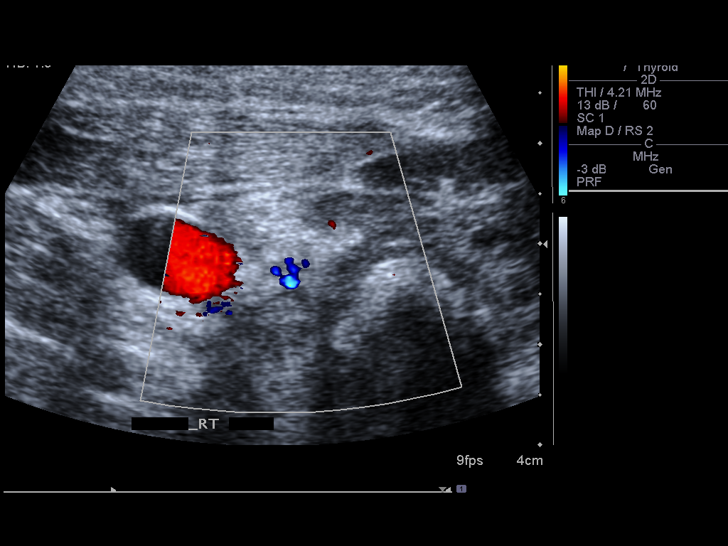
[im 18/40]
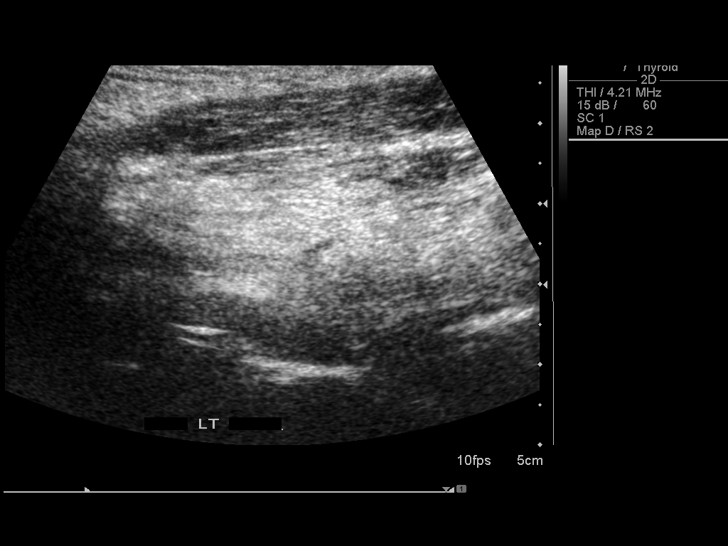
[im 22/40]
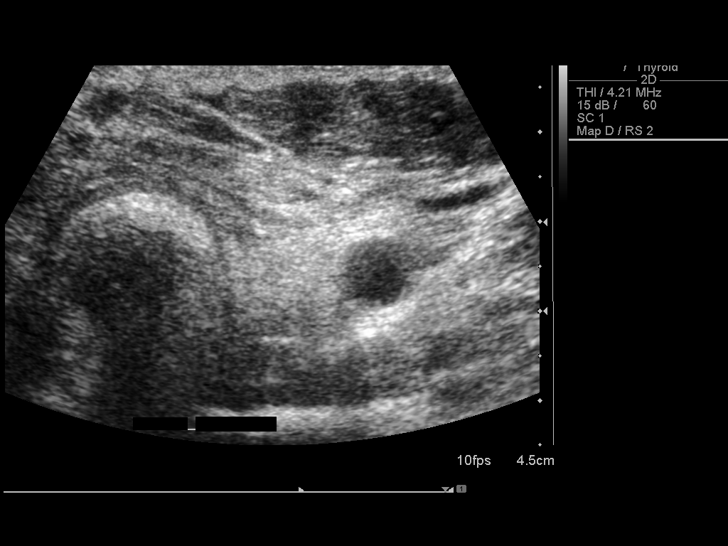
[im 25/40]
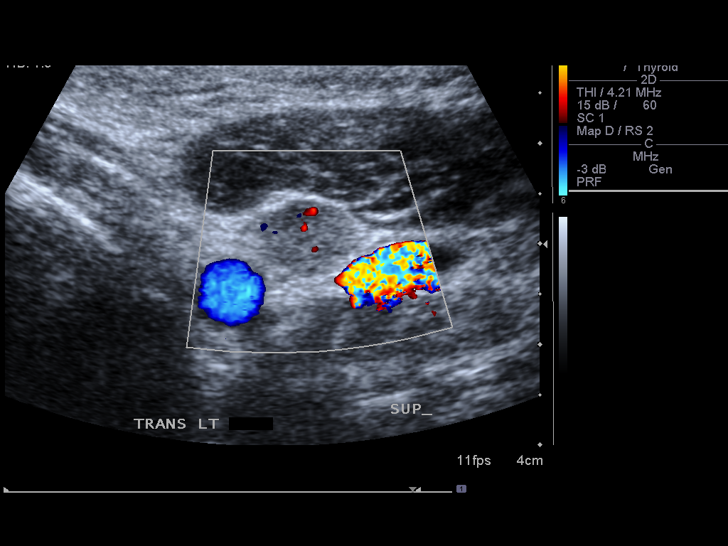
[im 27/40]
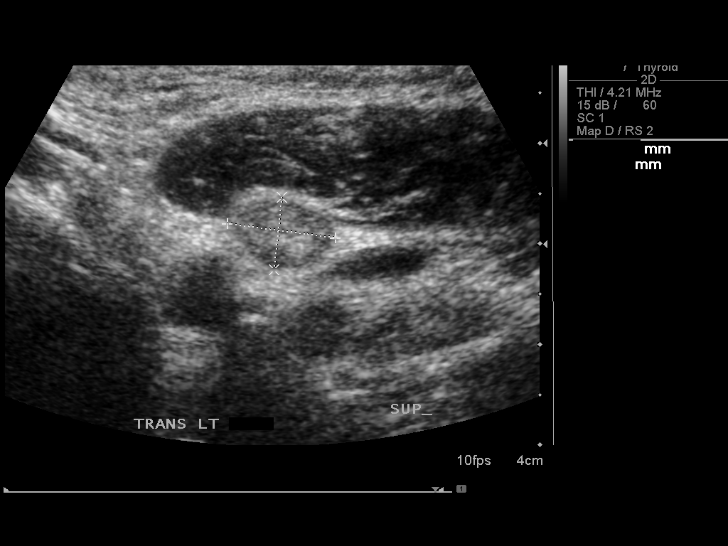
[im 30/40]
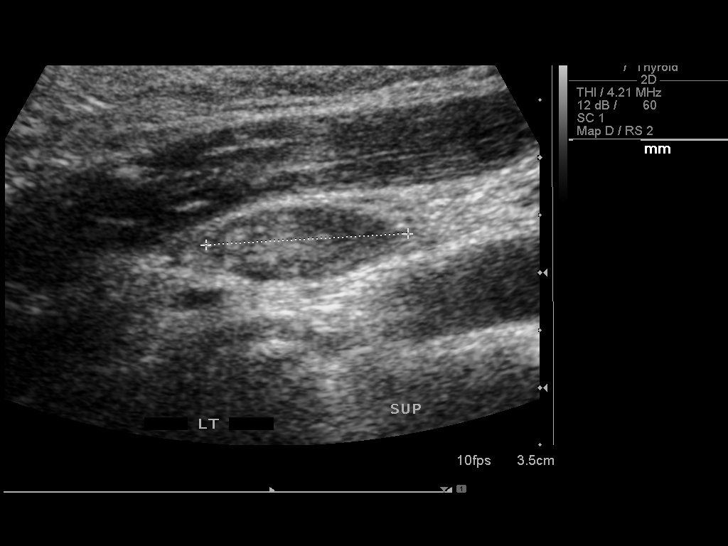
[im 33/40]
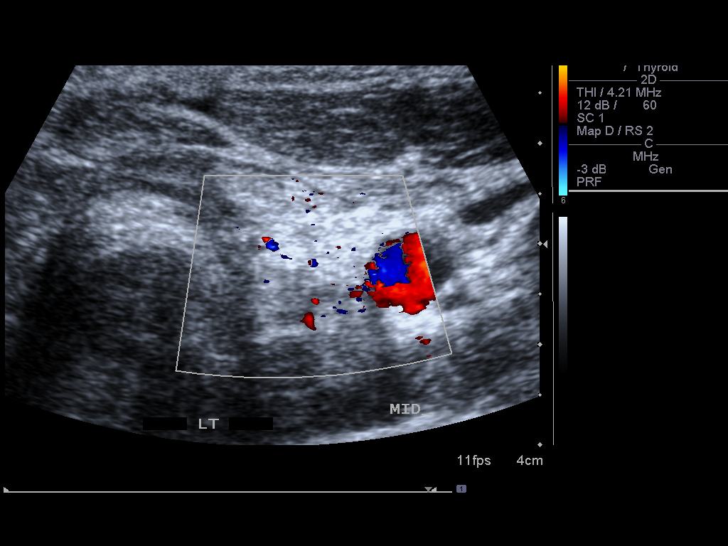
[im 36/40]
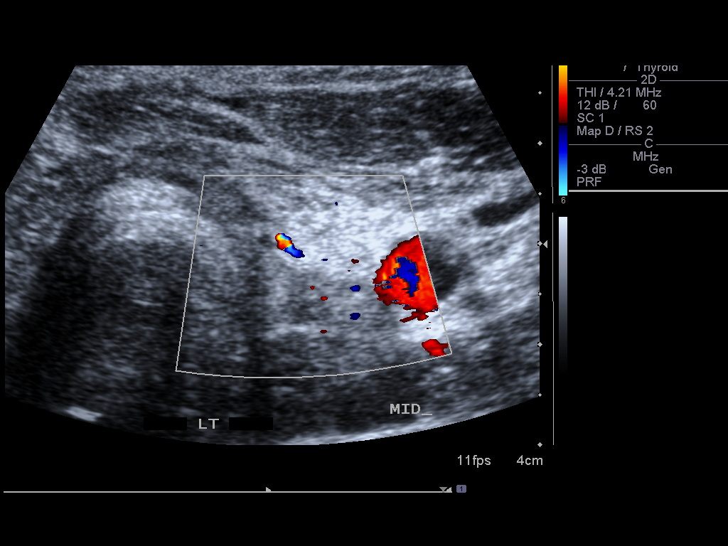
[im 40/40]
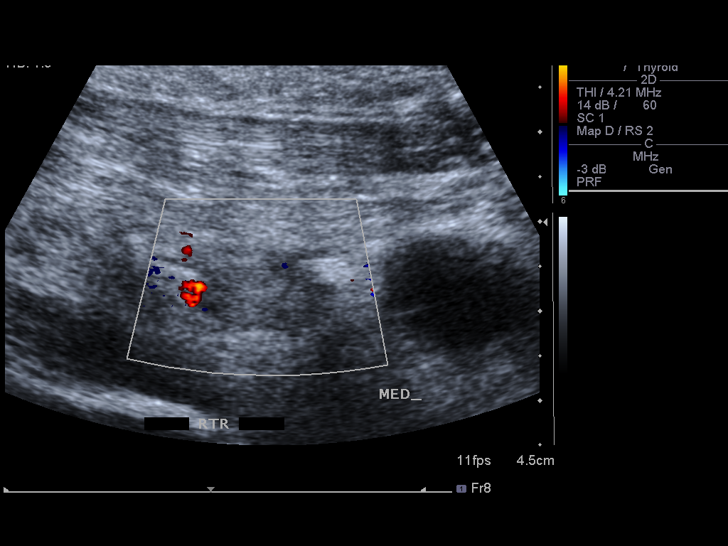

[14 of 25 positions shown; findings below may reference images not displayed]

FINDINGS: No residual thyroid tissue is seen with certainty. There is some
prominence of superficial soft tissues most likely postoperative in
nature. In addition there is a rounded nodule in the right neck
laterally which may represent a lymph node of 1.0 x 0.8 x 1.4 cm. In
addition there is a nodule in the left neck more superiorly of 1.1 x
0.7 x 1.8 cm. A smaller nodule is noted more inferiorly of 0.8 x
x 1.4 cm. These nodules appear to have somewhat echogenic centers
and most likely representing lymph nodes.
IMPRESSION: No definite residual thyroid tissue is seen. There are small nodules
in the neck most consistent with lymph nodes bilaterally as
described above.

## 2016-03-11 IMAGING — NM NM RAI THERAPY CANCER THYROID
1 series · 1 of 1 positions shown · non-contrast
Comparison: none

CLINICAL DATA: Papillary thyroid carcinoma.28-year-old male with T3
N1b papillary thyroid carcinoma. Extrathyroidal extension,
lymphovascular invasion, positive margins and positive lymph nodes
are all present on pathology. Patient presents for adjuvant therapy
and remnant ablation.

[Series 1: marker · 4.14mm/px · 1 of 1 slices shown]
[im 1/1]
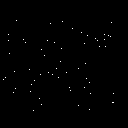

[1 of 1 positions shown; findings below may reference images not displayed]

EXAM:
RADIOACTIVE IODINE THERAPY FOR THYROID CANCER

PROCEDURE:
The risks and benefits of radioactive iodine therapy were discussed
with the patient in detail. Alternative therapies were also
mentioned. Radiation safety was discussed with the patient,
including how to protect the general public from exposure. There
were no barriers to communication. Written consent was obtained. The
patient then received a capsule containing the radiopharmaceutical.
Thyroid hormone withdrawal. TSH equals

The patient will follow-up with the referring physician.

RADIOPHARMACEUTICALS:  133 mCi F-1S1 sodium iodide
FINDINGS: 28-year-old male with T3 N1b papillary thyroid carcinoma.
Extrathyroidal extension, lymphovascular invasion, positive margins
and positive lymph nodes are all present on pathology. Patient
presents for adjuvant therapy and remnant ablation.
IMPRESSION: Per oral administration of 133 mCi F-1S1 sodium iodide for thyroid
remnant ablation and thyroid cancer adjuvant therapy.

## 2016-03-22 IMAGING — NM NM [ID] THYROID CANCER METS SP CA TX
6 series · 6 of 6 positions shown · non-contrast
Comparison: 04/29/2014

CLINICAL DATA: Status post I 131 therapy for thyroid cancer.

EXAM:
NUCLEAR MEDICINE F-050 POST THERAPY WHOLE BODY SCAN
TECHNIQUE: The patient received 133 mCi F-050 sodium iodide for the treatment
of thyroid cancer within the past 10 days. The patient returns
today, and whole body scanning was performed in the anterior and
posterior projections.

[Series 1: marker · 4.14mm/px · 1 of 1 slices shown (1 of 2)]
[im 1/1  full-range]
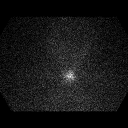

[Series 1: marker · 4.14mm/px · 1 of 1 slices shown (2 of 2)]
[im 1/1]
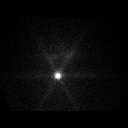

[Series 2: static thyroid no marker · 4.14mm/px · 1 of 1 slices shown (1 of 2)]
[im 1/1  full-range]
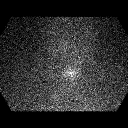

[Series 2: static thyroid no marker · 4.14mm/px · 1 of 1 slices shown (2 of 2)]
[im 1/1  full-range]
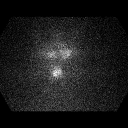

[Series 3: i131 whole body · 2.66mm/px · 1 of 1 slices shown (1 of 2)]
[im 1/1  full-range]
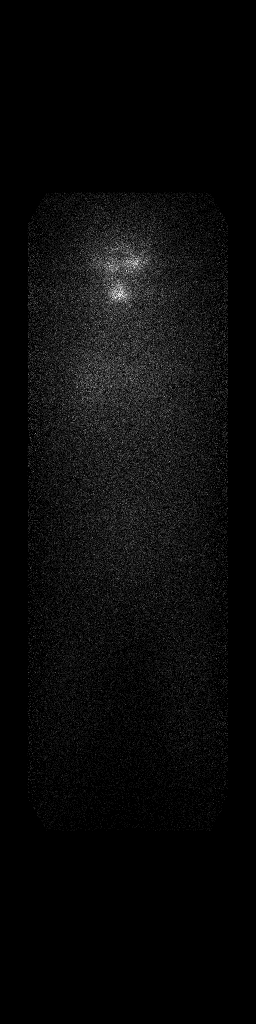

[Series 3: i131 whole body · 2.66mm/px · 1 of 1 slices shown (2 of 2)]
[im 1/1  full-range]
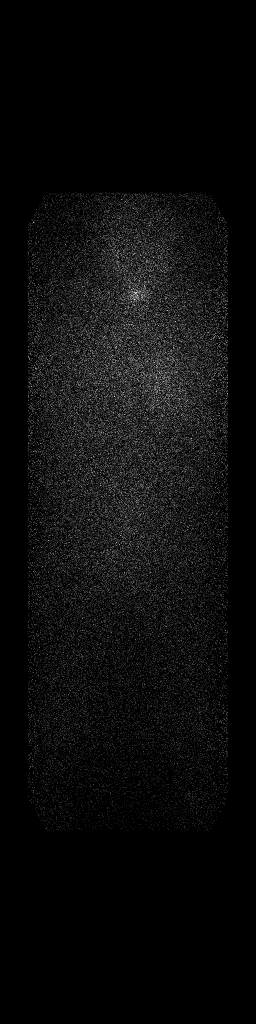

[6 of 6 positions shown; findings below may reference images not displayed]

FINDINGS: There is a large focus of intense radiotracer uptake identified
within the thyroid bed consistent with residual functioning thyroid
tissue. No evidence for iodine avid lymph node metastasis within the
neck or distant metastatic disease. Physiologic tracer activity seen
within the salivary glands, GI tract and GU tract.
IMPRESSION: 1. Expected residual functioning thyroid tissue within the thyroid
bed.
2. No evidence for iodine avid metastatic disease.
